# Patient Record
Sex: Male | Born: 1965 | Race: White | Hispanic: No | Marital: Single | Smoking: Current every day smoker
Health system: Southern US, Community
[De-identification: ages and names within clinical notes are randomized; demographics above are authoritative.]

## PROBLEM LIST (undated history)

## (undated) DIAGNOSIS — J449 Chronic obstructive pulmonary disease, unspecified: Secondary | ICD-10-CM

## (undated) DIAGNOSIS — F32A Depression, unspecified: Secondary | ICD-10-CM

## (undated) DIAGNOSIS — I10 Essential (primary) hypertension: Secondary | ICD-10-CM

## (undated) DIAGNOSIS — F329 Major depressive disorder, single episode, unspecified: Secondary | ICD-10-CM

## (undated) DIAGNOSIS — K219 Gastro-esophageal reflux disease without esophagitis: Secondary | ICD-10-CM

## (undated) HISTORY — DX: Essential (primary) hypertension: I10

## (undated) HISTORY — PX: APPENDECTOMY: SHX54

## (undated) HISTORY — DX: Gastro-esophageal reflux disease without esophagitis: K21.9

## (undated) HISTORY — PX: HERNIA REPAIR: SHX51

---

## 2013-01-23 HISTORY — PX: OTHER SURGICAL HISTORY: SHX169

## 2018-04-21 ENCOUNTER — Emergency Department: Payer: Self-pay

## 2018-04-21 ENCOUNTER — Inpatient Hospital Stay
Admission: EM | Admit: 2018-04-21 | Discharge: 2018-04-25 | DRG: 342 | Disposition: A | Discharge status: Home Health | Payer: Self-pay | Attending: Surgery | Admitting: Surgery

## 2018-04-21 ENCOUNTER — Encounter
Admission: EM | Disposition: A | Discharge status: Home Health | Payer: Self-pay | Source: Home / Self Care | Attending: Surgery

## 2018-04-21 ENCOUNTER — Inpatient Hospital Stay: Payer: Self-pay | Admitting: Anesthesiology

## 2018-04-21 ENCOUNTER — Other Ambulatory Visit: Payer: Self-pay

## 2018-04-21 DIAGNOSIS — J449 Chronic obstructive pulmonary disease, unspecified: Secondary | ICD-10-CM | POA: Diagnosis present

## 2018-04-21 DIAGNOSIS — Z23 Encounter for immunization: Secondary | ICD-10-CM

## 2018-04-21 DIAGNOSIS — K35891 Other acute appendicitis without perforation, with gangrene: Principal | ICD-10-CM | POA: Diagnosis present

## 2018-04-21 DIAGNOSIS — E860 Dehydration: Secondary | ICD-10-CM | POA: Diagnosis present

## 2018-04-21 DIAGNOSIS — K3580 Unspecified acute appendicitis: Secondary | ICD-10-CM | POA: Diagnosis present

## 2018-04-21 DIAGNOSIS — F329 Major depressive disorder, single episode, unspecified: Secondary | ICD-10-CM | POA: Diagnosis present

## 2018-04-21 DIAGNOSIS — E876 Hypokalemia: Secondary | ICD-10-CM | POA: Diagnosis not present

## 2018-04-21 DIAGNOSIS — F17210 Nicotine dependence, cigarettes, uncomplicated: Secondary | ICD-10-CM | POA: Diagnosis present

## 2018-04-21 DIAGNOSIS — R0602 Shortness of breath: Secondary | ICD-10-CM

## 2018-04-21 DIAGNOSIS — K567 Ileus, unspecified: Secondary | ICD-10-CM | POA: Diagnosis not present

## 2018-04-21 DIAGNOSIS — K429 Umbilical hernia without obstruction or gangrene: Secondary | ICD-10-CM | POA: Diagnosis present

## 2018-04-21 HISTORY — PX: UMBILICAL HERNIA REPAIR: SHX196

## 2018-04-21 HISTORY — DX: Chronic obstructive pulmonary disease, unspecified: J44.9

## 2018-04-21 HISTORY — DX: Major depressive disorder, single episode, unspecified: F32.9

## 2018-04-21 HISTORY — PX: LAPAROSCOPIC APPENDECTOMY: SHX408

## 2018-04-21 HISTORY — DX: Depression, unspecified: F32.A

## 2018-04-21 LAB — COMPREHENSIVE METABOLIC PANEL
ALT: 17 U/L (ref 0–44)
AST: 24 U/L (ref 15–41)
Albumin: 4.4 g/dL (ref 3.5–5.0)
Alkaline Phosphatase: 70 U/L (ref 38–126)
Anion gap: 14 (ref 5–15)
BUN: 13 mg/dL (ref 6–20)
CO2: 21 mmol/L — ABNORMAL LOW (ref 22–32)
Calcium: 9.5 mg/dL (ref 8.9–10.3)
Chloride: 99 mmol/L (ref 98–111)
Creatinine, Ser: 1.05 mg/dL (ref 0.61–1.24)
GFR calc Af Amer: >60 mL/min (ref >60)
GFR calc non Af Amer: >60 mL/min (ref >60)
Glucose, Bld: 154 mg/dL — ABNORMAL HIGH (ref 70–99)
Potassium: 4.1 mmol/L (ref 3.5–5.1)
Sodium: 134 mmol/L — ABNORMAL LOW (ref 135–145)
Total Bilirubin: 1.6 mg/dL — ABNORMAL HIGH (ref 0.3–1.2)
Total Protein: 8.2 g/dL — ABNORMAL HIGH (ref 6.5–8.1)

## 2018-04-21 LAB — URINALYSIS, COMPLETE (UACMP) WITH MICROSCOPIC
BACTERIA UA: NONE SEEN
Bilirubin Urine: NEGATIVE
Glucose, UA: NEGATIVE mg/dL
Hgb urine dipstick: NEGATIVE
Ketones, ur: 20 mg/dL — AB
Leukocytes,Ua: NEGATIVE
NITRITE: NEGATIVE
Protein, ur: NEGATIVE mg/dL
Specific Gravity, Urine: >1.046 — ABNORMAL HIGH (ref 1.005–1.030)
pH: 6 (ref 5.0–8.0)

## 2018-04-21 LAB — URINE DRUG SCREEN, QUALITATIVE (ARMC ONLY)
Amphetamines, Ur Screen: NOT DETECTED
BARBITURATES, UR SCREEN: NOT DETECTED
Benzodiazepine, Ur Scrn: NOT DETECTED
Cannabinoid 50 Ng, Ur ~~LOC~~: POSITIVE — AB
Cocaine Metabolite,Ur ~~LOC~~: NOT DETECTED
MDMA (Ecstasy)Ur Screen: NOT DETECTED
METHADONE SCREEN, URINE: NOT DETECTED
Opiate, Ur Screen: NOT DETECTED
Phencyclidine (PCP) Ur S: NOT DETECTED
Tricyclic, Ur Screen: NOT DETECTED

## 2018-04-21 LAB — BASIC METABOLIC PANEL
ANION GAP: 8 (ref 5–15)
BUN: 13 mg/dL (ref 6–20)
CALCIUM: 8.1 mg/dL — AB (ref 8.9–10.3)
CO2: 23 mmol/L (ref 22–32)
Chloride: 102 mmol/L (ref 98–111)
Creatinine, Ser: 1.13 mg/dL (ref 0.61–1.24)
GFR calc Af Amer: >60 mL/min (ref >60)
GFR calc non Af Amer: >60 mL/min (ref >60)
Glucose, Bld: 119 mg/dL — ABNORMAL HIGH (ref 70–99)
Potassium: 3.6 mmol/L (ref 3.5–5.1)
Sodium: 133 mmol/L — ABNORMAL LOW (ref 135–145)

## 2018-04-21 LAB — CBC WITH DIFFERENTIAL/PLATELET
Abs Immature Granulocytes: 0.12 10*3/uL — ABNORMAL HIGH (ref 0.00–0.07)
BASOS ABS: 0 10*3/uL (ref 0.0–0.1)
Basophils Relative: 0 %
Eosinophils Absolute: 0 10*3/uL (ref 0.0–0.5)
Eosinophils Relative: 0 %
HCT: 43.7 % (ref 39.0–52.0)
Hemoglobin: 15.3 g/dL (ref 13.0–17.0)
Immature Granulocytes: 1 %
Lymphocytes Relative: 5 %
Lymphs Abs: 0.8 10*3/uL (ref 0.7–4.0)
MCH: 31.7 pg (ref 26.0–34.0)
MCHC: 35 g/dL (ref 30.0–36.0)
MCV: 90.7 fL (ref 80.0–100.0)
Monocytes Absolute: 1.1 10*3/uL — ABNORMAL HIGH (ref 0.1–1.0)
Monocytes Relative: 7 %
NRBC: 0 % (ref 0.0–0.2)
Neutro Abs: 14.5 10*3/uL — ABNORMAL HIGH (ref 1.7–7.7)
Neutrophils Relative %: 87 %
Platelets: 253 10*3/uL (ref 150–400)
RBC: 4.82 MIL/uL (ref 4.22–5.81)
RDW: 11.9 % (ref 11.5–15.5)
WBC: 16.5 10*3/uL — ABNORMAL HIGH (ref 4.0–10.5)

## 2018-04-21 LAB — TYPE AND SCREEN
ABO/RH(D): O NEG
Antibody Screen: NEGATIVE

## 2018-04-21 LAB — LIPASE, BLOOD: Lipase: 24 U/L (ref 11–51)

## 2018-04-21 SURGERY — APPENDECTOMY, LAPAROSCOPIC
Anesthesia: General

## 2018-04-21 MED ORDER — HYDROMORPHONE HCL 1 MG/ML IJ SOLN
0.5000 mg | INTRAMUSCULAR | Status: DC | PRN
  Administered 2018-04-21 – 2018-04-22 (×4): 0.5 mg via INTRAVENOUS
  Filled 2018-04-21 (×2): qty 0.5
  Filled 2018-04-21: qty 1
  Filled 2018-04-21: qty 0.5

## 2018-04-21 MED ORDER — FENTANYL CITRATE (PF) 100 MCG/2ML IJ SOLN
INTRAMUSCULAR | Status: DC | PRN
  Administered 2018-04-21 – 2018-04-22 (×2): 50 ug via INTRAVENOUS

## 2018-04-21 MED ORDER — SODIUM CHLORIDE 0.9 % IV BOLUS
1000.0000 mL | Freq: Once | INTRAVENOUS | Status: AC
  Administered 2018-04-21: 1000 mL via INTRAVENOUS

## 2018-04-21 MED ORDER — IOPAMIDOL (ISOVUE-300) INJECTION 61%: 100.0000 mL | Freq: Once | INTRAVENOUS | Status: DC | PRN

## 2018-04-21 MED ORDER — ONDANSETRON 4 MG PO TBDP: 4.0000 mg | ORAL_TABLET | Freq: Four times a day (QID) | ORAL | Status: DC | PRN

## 2018-04-21 MED ORDER — PNEUMOCOCCAL VAC POLYVALENT 25 MCG/0.5ML IJ INJ
0.5000 mL | INJECTION | INTRAMUSCULAR | Status: AC
  Administered 2018-04-22: 0.5 mL via INTRAMUSCULAR
  Filled 2018-04-21: qty 0.5

## 2018-04-21 MED ORDER — LACTATED RINGERS IV SOLN
INTRAVENOUS | Status: DC | PRN
  Administered 2018-04-21: 23:00:00 via INTRAVENOUS

## 2018-04-21 MED ORDER — ONDANSETRON 4 MG PO TBDP: 4.0000 mg | ORAL_TABLET | Freq: Once | ORAL | Status: DC

## 2018-04-21 MED ORDER — MIDAZOLAM HCL 2 MG/2ML IJ SOLN
INTRAMUSCULAR | Status: AC
  Filled 2018-04-21: qty 2

## 2018-04-21 MED ORDER — MIDAZOLAM HCL 2 MG/2ML IJ SOLN
INTRAMUSCULAR | Status: DC | PRN
  Administered 2018-04-21: 2 mg via INTRAVENOUS

## 2018-04-21 MED ORDER — FENTANYL CITRATE (PF) 250 MCG/5ML IJ SOLN
INTRAMUSCULAR | Status: AC
  Filled 2018-04-21: qty 5

## 2018-04-21 MED ORDER — PHENYLEPHRINE HCL 10 MG/ML IJ SOLN
INTRAMUSCULAR | Status: DC | PRN
  Administered 2018-04-21: 100 ug via INTRAVENOUS

## 2018-04-21 MED ORDER — HYDROMORPHONE HCL 1 MG/ML IJ SOLN
1.0000 mg | INTRAMUSCULAR | Status: AC
  Administered 2018-04-21: 1 mg via INTRAVENOUS

## 2018-04-21 MED ORDER — PROPOFOL 10 MG/ML IV BOLUS
INTRAVENOUS | Status: DC | PRN
  Administered 2018-04-21: 120 mg via INTRAVENOUS

## 2018-04-21 MED ORDER — HYDROMORPHONE HCL 1 MG/ML IJ SOLN
INTRAMUSCULAR | Status: AC
  Filled 2018-04-21: qty 1

## 2018-04-21 MED ORDER — IOHEXOL 300 MG/ML  SOLN
100.0000 mL | Freq: Once | INTRAMUSCULAR | Status: AC | PRN
  Administered 2018-04-21: 100 mL via INTRAVENOUS

## 2018-04-21 MED ORDER — LACTATED RINGERS IV SOLN
125.0000 mL/h | INTRAVENOUS | Status: DC
  Administered 2018-04-21 (×2): 125 mL/h via INTRAVENOUS

## 2018-04-21 MED ORDER — ACETAMINOPHEN 10 MG/ML IV SOLN
INTRAVENOUS | Status: AC
  Filled 2018-04-21: qty 100

## 2018-04-21 MED ORDER — SUCCINYLCHOLINE CHLORIDE 20 MG/ML IJ SOLN
INTRAMUSCULAR | Status: DC | PRN
  Administered 2018-04-21: 140 mg via INTRAVENOUS

## 2018-04-21 MED ORDER — BUPIVACAINE LIPOSOME 1.3 % IJ SUSP
INTRAMUSCULAR | Status: AC
  Filled 2018-04-21: qty 20

## 2018-04-21 MED ORDER — BUPIVACAINE-EPINEPHRINE (PF) 0.25% -1:200000 IJ SOLN
INTRAMUSCULAR | Status: AC
  Filled 2018-04-21: qty 30

## 2018-04-21 MED ORDER — PIPERACILLIN-TAZOBACTAM 3.375 G IVPB
3.3750 g | Freq: Three times a day (TID) | INTRAVENOUS | Status: DC
  Administered 2018-04-21 – 2018-04-25 (×12): 3.375 g via INTRAVENOUS
  Filled 2018-04-21 (×12): qty 50

## 2018-04-21 MED ORDER — LIDOCAINE HCL (CARDIAC) PF 100 MG/5ML IV SOSY
PREFILLED_SYRINGE | INTRAVENOUS | Status: DC | PRN
  Administered 2018-04-21: 100 mg via INTRAVENOUS

## 2018-04-21 MED ORDER — ACETAMINOPHEN 10 MG/ML IV SOLN
INTRAVENOUS | Status: DC | PRN
  Administered 2018-04-21: 1000 mg via INTRAVENOUS

## 2018-04-21 MED ORDER — INFLUENZA VAC SPLIT QUAD 0.5 ML IM SUSY
0.5000 mL | PREFILLED_SYRINGE | INTRAMUSCULAR | Status: AC
  Administered 2018-04-22: 0.5 mL via INTRAMUSCULAR
  Filled 2018-04-21 (×2): qty 0.5

## 2018-04-21 MED ORDER — KETOROLAC TROMETHAMINE 30 MG/ML IJ SOLN
INTRAMUSCULAR | Status: AC
  Filled 2018-04-21: qty 1

## 2018-04-21 MED ORDER — PANTOPRAZOLE SODIUM 40 MG IV SOLR
40.0000 mg | Freq: Every day | INTRAVENOUS | Status: DC
  Administered 2018-04-21 – 2018-04-24 (×4): 40 mg via INTRAVENOUS
  Filled 2018-04-21 (×4): qty 40

## 2018-04-21 MED ORDER — ONDANSETRON HCL 4 MG/2ML IJ SOLN
INTRAMUSCULAR | Status: AC
  Administered 2018-04-21: 4 mg
  Filled 2018-04-21: qty 2

## 2018-04-21 MED ORDER — KETOROLAC TROMETHAMINE 30 MG/ML IJ SOLN
30.0000 mg | Freq: Four times a day (QID) | INTRAMUSCULAR | Status: DC
  Administered 2018-04-21 (×2): 30 mg via INTRAVENOUS
  Filled 2018-04-21: qty 1

## 2018-04-21 MED ORDER — ONDANSETRON HCL 4 MG/2ML IJ SOLN
4.0000 mg | Freq: Four times a day (QID) | INTRAMUSCULAR | Status: DC | PRN
  Administered 2018-04-21: 4 mg via INTRAVENOUS
  Filled 2018-04-21 (×2): qty 2

## 2018-04-21 MED ORDER — SODIUM CHLORIDE 0.9 % IV SOLN
INTRAVENOUS | Status: DC | PRN
  Administered 2018-04-21 – 2018-04-22 (×2): 250 mL via INTRAVENOUS
  Administered 2018-04-23: 1 mL via INTRAVENOUS

## 2018-04-21 MED ORDER — ENOXAPARIN SODIUM 40 MG/0.4ML ~~LOC~~ SOLN
40.0000 mg | SUBCUTANEOUS | Status: DC
  Administered 2018-04-21 – 2018-04-24 (×4): 40 mg via SUBCUTANEOUS
  Filled 2018-04-21 (×4): qty 0.4

## 2018-04-21 SURGICAL SUPPLY — 39 items
BULB RESERV EVAC DRAIN JP 100C (MISCELLANEOUS) ×4 IMPLANT
CANISTER SUCT 1200ML W/VALVE (MISCELLANEOUS) ×4 IMPLANT
CHLORAPREP W/TINT 10.5 ML (MISCELLANEOUS) ×4 IMPLANT
COVER WAND RF STERILE (DRAPES) ×4 IMPLANT
CUTTER FLEX LINEAR 45M (STAPLE) ×4 IMPLANT
DRAIN CHANNEL JP 19F (MISCELLANEOUS) ×4 IMPLANT
DRAPE LAPAROTOMY 100X77 ABD (DRAPES) ×4 IMPLANT
DRSG OPSITE POSTOP 4X12 (GAUZE/BANDAGES/DRESSINGS) ×4 IMPLANT
DRSG TEGADERM 4X10 (GAUZE/BANDAGES/DRESSINGS) ×4 IMPLANT
ELECT CAUTERY BLADE 6.4 (BLADE) ×4 IMPLANT
ELECT REM PT RETURN 9FT ADLT (ELECTROSURGICAL) ×4
ELECTRODE REM PT RTRN 9FT ADLT (ELECTROSURGICAL) ×2 IMPLANT
GAUZE SPONGE 4X4 12PLY STRL (GAUZE/BANDAGES/DRESSINGS) ×4 IMPLANT
GLOVE SURG SYN 7.0 (GLOVE) ×8 IMPLANT
GLOVE SURG SYN 7.5  E (GLOVE) ×4
GLOVE SURG SYN 7.5 E (GLOVE) ×4 IMPLANT
GOWN STRL REUS W/ TWL LRG LVL3 (GOWN DISPOSABLE) ×8 IMPLANT
GOWN STRL REUS W/TWL LRG LVL3 (GOWN DISPOSABLE) ×8
LABEL OR SOLS (LABEL) ×4 IMPLANT
LIGASURE IMPACT 36 18CM CVD LR (INSTRUMENTS) IMPLANT
LIGASURE LAP MARYLAND 5MM 37CM (ELECTROSURGICAL) ×4 IMPLANT
NEEDLE HYPO 22GX1.5 SAFETY (NEEDLE) ×4 IMPLANT
NS IRRIG 1000ML POUR BTL (IV SOLUTION) ×4 IMPLANT
PACK BASIN MAJOR ARMC (MISCELLANEOUS) ×4 IMPLANT
PACK COLON CLEAN CLOSURE (MISCELLANEOUS) ×4 IMPLANT
POUCH SPECIMEN RETRIEVAL 10MM (ENDOMECHANICALS) ×8 IMPLANT
RELOAD 45 VASCULAR/THIN (ENDOMECHANICALS) ×4 IMPLANT
RELOAD STAPLE TA45 3.5 REG BLU (ENDOMECHANICALS) ×8 IMPLANT
SEPRAFILM MEMBRANE 5X6 (MISCELLANEOUS) IMPLANT
STAPLER SKIN PROX 35W (STAPLE) ×4 IMPLANT
SUT PDS AB 1 CT1 36 (SUTURE) ×4 IMPLANT
SUT PROLENE 0 CT 1 30 (SUTURE) ×12 IMPLANT
SUT SILK 2 0 (SUTURE) ×2
SUT SILK 2-0 18XBRD TIE 12 (SUTURE) ×2 IMPLANT
SUT SILK 3-0 (SUTURE) ×4 IMPLANT
SUT VIC AB 3-0 SH 27 (SUTURE) ×2
SUT VIC AB 3-0 SH 27X BRD (SUTURE) ×2 IMPLANT
SYR 10ML LL (SYRINGE) ×4 IMPLANT
TRAY FOLEY MTR SLVR 16FR STAT (SET/KITS/TRAYS/PACK) ×4 IMPLANT

## 2018-04-21 NOTE — ED Triage Notes (Signed)
Pt arrived via ems for report of right lower quad pai nwith N/V since last pm - noted large amount of green vomit in ems emesis bag

## 2018-04-21 NOTE — Anesthesia Preprocedure Evaluation (Signed)
Anesthesia Evaluation  Patient identified by MRN, date of birth, ID band Patient awake    Reviewed: Allergy & Precautions, H&P , NPO status , Patient's Chart, lab work & pertinent test results  History of Anesthesia Complications Negative for: history of anesthetic complications  Airway Mallampati: III  TM Distance: <3 FB Neck ROM: limited    Dental  (+) Chipped, Poor Dentition, Missing   Pulmonary shortness of breath and with exertion, COPD, Current Smoker,           Cardiovascular Exercise Tolerance: Good (-) angina(-) Past MI and (-) DOE negative cardio ROS       Neuro/Psych PSYCHIATRIC DISORDERS negative neurological ROS     GI/Hepatic negative GI ROS, Neg liver ROS, neg GERD  ,  Endo/Other  negative endocrine ROS  Renal/GU      Musculoskeletal   Abdominal   Peds  Hematology negative hematology ROS (+)   Anesthesia Other Findings Past Medical History: No date: COPD (chronic obstructive pulmonary disease) (HCC) No date: Depression  History reviewed. No pertinent surgical history.  BMI    Body Mass Index:  22.24 kg/m      Reproductive/Obstetrics negative OB ROS                             Anesthesia Physical Anesthesia Plan  ASA: III and emergent  Anesthesia Plan: General ETT   Post-op Pain Management:    Induction: Intravenous  PONV Risk Score and Plan: Ondansetron, Dexamethasone, Midazolam and Treatment may vary due to age or medical condition  Airway Management Planned: Oral ETT  Additional Equipment:   Intra-op Plan:   Post-operative Plan: Extubation in OR and Possible Post-op intubation/ventilation  Informed Consent: I have reviewed the patients History and Physical, chart, labs and discussed the procedure including the risks, benefits and alternatives for the proposed anesthesia with the patient or authorized representative who has indicated his/her  understanding and acceptance.     Dental Advisory Given  Plan Discussed with: Anesthesiologist, CRNA and Surgeon  Anesthesia Plan Comments: (Patient consented for risks of anesthesia including but not limited to:  - adverse reactions to medications - damage to teeth, lips or other oral mucosa - sore throat or hoarseness - Damage to heart, brain, lungs or loss of life  Patient voiced understanding.)        Anesthesia Quick Evaluation

## 2018-04-21 NOTE — ED Notes (Addendum)
Noted O2 sat 81% on RA with good waveform. Pt placed on 2L O2 via n/c with O2 increase to 97%. Primary RN aware.

## 2018-04-21 NOTE — ED Notes (Signed)
Report given to Julie RN.
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
## 2018-04-21 NOTE — Progress Notes (Signed)
04/21/18  Patient developing low grade temperature tonight.  Continues having significant pain in the right side of abdomen.  Has low urine volume on bladder scan for period of 9 hours.  On exam, exquisitely tender int he right abdomen, peritoneal signs.  Given some clinical deterioration with persistent pain, will take to OR tonight.  Will attempt laparoscopic appendectomy.  However, discussed with patient that if there is too much inflammation that has extended beyond the appendix, we would have to do a bigger resection like a right colectomy in order to be able to have clean healthy tissue edges.  Patient understands and is willing to proceed.   Henrene Dodge, MD

## 2018-04-21 NOTE — ED Notes (Signed)
ED TO INPATIENT HANDOFF REPORT  ED Nurse Name and Phone #: Rosey Bath [phone]  S Name/Age/Gender Margretta Sidle 53 y.o. male Room/Bed: ED15A/ED15A  Code Status   Code Status: Full Code  Home/SNF/Other Home Patient oriented to: self, place, time and situation Is this baseline? Yes   Triage Complete: Triage complete  Chief Complaint abdominal pain  Triage Note Pt arrived via ems for report of right lower quad pai nwith N/V since last pm - noted large amount of green vomit in ems emesis bag   Allergies No Known Allergies  Level of Care/Admitting Diagnosis ED Disposition    ED Disposition Condition Comment   Admit  Hospital Area: [facility] [[id number]]  Level of Care: Med-Surg [16]  Diagnosis: Acute appendicitis [960454]  Admitting Physician: Henrene Dodge [[id number]]  Attending Physician: Henrene Dodge [[id number]]  Estimated length of stay: 3 - 4 days  Certification:: I certify this patient will need inpatient services for at least 2 midnights  PT Class (Do Not Modify): Inpatient [101]  PT Acc Code (Do Not Modify): Private [1]       B Medical/Surgery History Past Medical History:  Diagnosis Date  . COPD (chronic obstructive pulmonary disease) (HCC)   . Depression    History reviewed. No pertinent surgical history.   A IV Location/Drains/Wounds Patient Lines/Drains/Airways Status   Active Line/Drains/Airways    Name:   Placement date:   Placement time:   Site:   Days:   Peripheral IV 04/21/18 Left Wrist   04/21/18    0935    Wrist   less than 1   Peripheral IV 04/21/18 Left Antecubital   04/21/18    0947    Antecubital   less than 1          Intake/Output Last 24 hours  Intake/Output Summary (Last 24 hours) at 04/21/2018 1215 Last data filed at 04/21/2018 1159 Gross per 24 hour  Intake 1000 ml  Output -  Net 1000 ml    Labs/Imaging Results for orders placed or performed during the hospital encounter of 04/21/18 (from the past 48 hour(s))   Lipase, blood     Status: None   Collection Time: 04/21/18  9:47 AM  Result Value Ref Range   Lipase 24 11 - 51 U/L    Comment: Performed at [facility], [address] [place], [place] [address]  Comprehensive metabolic panel     Status: Abnormal   Collection Time: 04/21/18  9:47 AM  Result Value Ref Range   Sodium 134 (L) 135 - 145 mmol/L   Potassium 4.1 3.5 - 5.1 mmol/L   Chloride 99 98 - 111 mmol/L   CO2 21 (L) 22 - 32 mmol/L   Glucose, Bld 154 (H) 70 - 99 mg/dL   BUN 13 6 - 20 mg/dL   Creatinine, Ser 6.57 0.61 - 1.24 mg/dL   Calcium 9.5 8.9 - 84.6 mg/dL   Total Protein 8.2 (H) 6.5 - 8.1 g/dL   Albumin 4.4 3.5 - 5.0 g/dL   AST 24 15 - 41 U/L   ALT 17 0 - 44 U/L   Alkaline Phosphatase 70 38 - 126 U/L   Total Bilirubin 1.6 (H) 0.3 - 1.2 mg/dL   GFR calc non Af Amer >60 >60 mL/min   GFR calc Af Amer >60 >60 mL/min   Anion gap 14 5 - 15    Comment: Performed at [facility], [address], [place], [place] [address]  CBC with Differential  Status: Abnormal   Collection Time: 04/21/18  9:47 AM  Result Value Ref Range   WBC 16.5 (H) 4.0 - 10.5 K/uL   RBC 4.82 4.22 - 5.81 MIL/uL   Hemoglobin 15.3 13.0 - 17.0 g/dL   HCT 58.0 99.8 - 33.8 %   MCV 90.7 80.0 - 100.0 fL   MCH 31.7 26.0 - 34.0 pg   MCHC 35.0 30.0 - 36.0 g/dL   RDW 25.0 53.9 - 76.7 %   Platelets 253 150 - 400 K/uL   nRBC 0.0 0.0 - 0.2 %   Neutrophils Relative % 87 %   Neutro Abs 14.5 (H) 1.7 - 7.7 K/uL   Lymphocytes Relative 5 %   Lymphs Abs 0.8 0.7 - 4.0 K/uL   Monocytes Relative 7 %   Monocytes Absolute 1.1 (H) 0.1 - 1.0 K/uL   Eosinophils Relative 0 %   Eosinophils Absolute 0.0 0.0 - 0.5 K/uL   Basophils Relative 0 %   Basophils Absolute 0.0 0.0 - 0.1 K/uL   Immature Granulocytes 1 %   Abs Immature Granulocytes 0.12 (H) 0.00 - 0.07 K/uL    Comment: Performed at [facility], [address] [place], [place] [address]   Ct Abdomen Pelvis W Contrast  Result Date:  04/21/2018 CLINICAL DATA:  Right lower quadrant pain starting last night. Appendicitis suspected. EXAM: CT ABDOMEN AND PELVIS WITH CONTRAST TECHNIQUE: Multidetector CT imaging of the abdomen and pelvis was performed using the standard protocol following bolus administration of intravenous contrast. CONTRAST:  OMNIPAQUE IOHEXOL 300 MG/ML SOLN, <See Chart> ISOVUE-300 IOPAMIDOL (ISOVUE-300) INJECTION 61% COMPARISON:  None. FINDINGS: Lower chest: Emphysema. Right base subsegmental atelectasis. Normal heart size without pericardial or pleural effusion. Tiny hiatal hernia. Hepatobiliary: Normal liver. Normal gallbladder, without biliary ductal dilatation. Pancreas: Normal, without mass or ductal dilatation. Spleen: Normal in size, without focal abnormality. Adrenals/Urinary Tract: Left greater than right adrenal thickening. Bilateral too small to characterize renal lesions, likely cysts. No hydronephrosis. Normal urinary bladder. Stomach/Bowel: Normal remainder of the stomach. Extensive colonic diverticulosis. The terminal ileum is positioned anterior to inflammation including on image 47/2. Appendix: Location: Typical. Diameter: 1.7 cm proximally on image 45/2. Fluid and gas filled distally, including at up to 1.6 cm on image 34/2. Appendicolith: Absent Mucosal hyper-enhancement: Absent Extraluminal gas: Suspected, trace medially on image 41/5 Periappendiceal collection: Moderate periappendiceal edema, without well-circumscribed fluid collection. Normal small bowel. Vascular/Lymphatic: Aortic atherosclerosis. Upper normal ileocolic mesenteric nodes, including at 7 mm on image 38/2. Likely reactive. No abdominal and no pelvic sidewall adenopathy. Reproductive: Normal prostate. Other: Trace cul-de-sac fluid, including on image 67/2. Musculoskeletal: Transitional S1 vertebral body. IMPRESSION: 1. Findings consistent with appendicitis. Possible microperforation, with subtle periappendiceal gas suspected. No  extraluminal fluid collection. 2.  Tiny hiatal hernia. 3.  Aortic Atherosclerosis (ICD10-I70.0). 4. Trace cul-de-sac fluid, likely secondary. 5.  Emphysema (ICD10-J43.9). These results were called by telephone at the time of interpretation on 04/21/2018 at 10:46 am to Dr. Sharyn Creamer , who verbally acknowledged these results. Electronically Signed   By: Jeronimo Greaves M.D.   On: 04/21/2018 10:49   Dg Chest Port 1 View  Result Date: 04/21/2018 CLINICAL DATA:  Right lower quadrant pain, nausea and vomiting. Hypoxia on room air with history of smoking and COPD. EXAM: PORTABLE CHEST 1 VIEW COMPARISON:  None. FINDINGS: The heart size and mediastinal contours are within normal limits. Bronchial prominence bilaterally specially in the lower lung zones and mild diffuse bilateral interstitial prominence. This likely reflects underlying chronic lung  disease. Component active bronchitis cannot be excluded. No overt edema, focal airspace consolidation or pneumothorax identified. The visualized skeletal structures are unremarkable. IMPRESSION: Bronchial and interstitial prominence in the lungs likely reflects chronic disease. Active bronchitis cannot be excluded. Electronically Signed   By: Irish Lack M.D.   On: 04/21/2018 10:08    Pending Labs Unresulted Labs (From admission, onward)    Start     Ordered   04/22/18 0500  Basic metabolic panel  Tomorrow morning,   STAT     04/21/18 1202   04/22/18 0500  Magnesium  Tomorrow morning,   STAT     04/21/18 1202   04/22/18 0500  CBC WITH DIFFERENTIAL  Tomorrow morning,   STAT     04/21/18 1202   04/21/18 1204  Urine Drug Screen, Qualitative ([facility] only)  Once,   STAT     04/21/18 1203   04/21/18 1201  HIV antibody (Routine Testing)  Once,   STAT     04/21/18 1202   04/21/18 0939  Urinalysis, Complete w Microscopic  ONCE - STAT,   STAT     04/21/18 0938          Vitals/Pain Today's Vitals   04/21/18 0954 04/21/18 0957 04/21/18 0958 04/21/18 1000  BP:     129/77  Pulse: 80 82 90 76  Resp: 12 (!) 32 (!) 28 16  Temp:      TempSrc:      SpO2: (!) 89% (!) 81% 97% 97%  Weight:      Height:      PainSc:        Isolation Precautions No active isolations  Medications Medications  iopamidol (ISOVUE-300) 61 % injection 100 mL ( Intravenous Canceled Entry 04/21/18 1022)  lactated ringers infusion (has no administration in time range)  ketorolac (TORADOL) 30 MG/ML injection 30 mg (has no administration in time range)  HYDROmorphone (DILAUDID) injection 0.5 mg (has no administration in time range)  ondansetron (ZOFRAN-ODT) disintegrating tablet 4 mg (has no administration in time range)    Or  ondansetron (ZOFRAN) injection 4 mg (has no administration in time range)  pantoprazole (PROTONIX) injection 40 mg (has no administration in time range)  enoxaparin (LOVENOX) injection 40 mg (has no administration in time range)  piperacillin-tazobactam (ZOSYN) IVPB 3.375 g (has no administration in time range)  ketorolac (TORADOL) 30 MG/ML injection (has no administration in time range)  HYDROmorphone (DILAUDID) injection 1 mg (1 mg Intravenous Given 04/21/18 0945)  sodium chloride 0.9 % bolus 1,000 mL (0 mLs Intravenous Stopped 04/21/18 1159)  ondansetron (ZOFRAN) 4 MG/2ML injection (4 mg  Given 04/21/18 0946)  iohexol (OMNIPAQUE) 300 MG/ML solution 100 mL (100 mLs Intravenous Contrast Given 04/21/18 1022)    Mobility walks Low fall risk   Focused Assessments Cardiac Assessment Handoff:    No results found for: CKTOTAL, CKMB, CKMBINDEX, TROPONINI No results found for: DDIMER Does the Patient currently have chest pain? No      R Recommendations: See Admitting Provider Note  Report given to:   Additional Notes: appendicitis that may require surgery today rather than waiting until tomorrow
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
## 2018-04-22 ENCOUNTER — Encounter: Payer: Self-pay | Admitting: Surgery

## 2018-04-22 DIAGNOSIS — K429 Umbilical hernia without obstruction or gangrene: Secondary | ICD-10-CM

## 2018-04-22 LAB — CBC WITH DIFFERENTIAL/PLATELET
Abs Immature Granulocytes: 0.05 10*3/uL (ref 0.00–0.07)
BASOS PCT: 0 %
Basophils Absolute: 0 10*3/uL (ref 0.0–0.1)
Eosinophils Absolute: 0.1 10*3/uL (ref 0.0–0.5)
Eosinophils Relative: 1 %
HCT: 39.6 % (ref 39.0–52.0)
HEMOGLOBIN: 13.1 g/dL (ref 13.0–17.0)
Immature Granulocytes: 0 %
Lymphocytes Relative: 4 %
Lymphs Abs: 0.7 10*3/uL (ref 0.7–4.0)
MCH: 31.7 pg (ref 26.0–34.0)
MCHC: 33.1 g/dL (ref 30.0–36.0)
MCV: 95.9 fL (ref 80.0–100.0)
Monocytes Absolute: 0.6 10*3/uL (ref 0.1–1.0)
Monocytes Relative: 4 %
Neutro Abs: 13.9 10*3/uL — ABNORMAL HIGH (ref 1.7–7.7)
Neutrophils Relative %: 91 %
Platelets: 203 10*3/uL (ref 150–400)
RBC: 4.13 MIL/uL — ABNORMAL LOW (ref 4.22–5.81)
RDW: 12.2 % (ref 11.5–15.5)
Smear Review: NORMAL
WBC: 15.3 10*3/uL — AB (ref 4.0–10.5)
nRBC: 0 % (ref 0.0–0.2)

## 2018-04-22 LAB — BASIC METABOLIC PANEL
Anion gap: 7 (ref 5–15)
BUN: 12 mg/dL (ref 6–20)
CO2: 25 mmol/L (ref 22–32)
Calcium: 7.9 mg/dL — ABNORMAL LOW (ref 8.9–10.3)
Chloride: 102 mmol/L (ref 98–111)
Creatinine, Ser: 1.12 mg/dL (ref 0.61–1.24)
GFR calc Af Amer: >60 mL/min (ref >60)
GFR calc non Af Amer: >60 mL/min (ref >60)
Glucose, Bld: 154 mg/dL — ABNORMAL HIGH (ref 70–99)
Potassium: 3.9 mmol/L (ref 3.5–5.1)
Sodium: 134 mmol/L — ABNORMAL LOW (ref 135–145)

## 2018-04-22 LAB — MAGNESIUM: MAGNESIUM: 1.7 mg/dL (ref 1.7–2.4)

## 2018-04-22 LAB — MRSA PCR SCREENING: MRSA by PCR: NEGATIVE

## 2018-04-22 MED ORDER — LIDOCAINE HCL (PF) 2 % IJ SOLN
INTRAMUSCULAR | Status: AC
  Filled 2018-04-22: qty 10

## 2018-04-22 MED ORDER — PHENOL 1.4 % MT LIQD
1.0000 | OROMUCOSAL | Status: DC | PRN
  Administered 2018-04-22 – 2018-04-23 (×2): 1 via OROMUCOSAL
  Filled 2018-04-22: qty 177

## 2018-04-22 MED ORDER — HYDROMORPHONE HCL 1 MG/ML IJ SOLN: 0.2500 mg | INTRAMUSCULAR | Status: DC | PRN

## 2018-04-22 MED ORDER — ONDANSETRON HCL 4 MG/2ML IJ SOLN
INTRAMUSCULAR | Status: DC | PRN
  Administered 2018-04-22: 4 mg via INTRAVENOUS

## 2018-04-22 MED ORDER — KETOROLAC TROMETHAMINE 30 MG/ML IJ SOLN
30.0000 mg | Freq: Four times a day (QID) | INTRAMUSCULAR | Status: DC
  Administered 2018-04-22 – 2018-04-25 (×13): 30 mg via INTRAVENOUS
  Filled 2018-04-22 (×13): qty 1

## 2018-04-22 MED ORDER — KETOROLAC TROMETHAMINE 30 MG/ML IJ SOLN
15.0000 mg | Freq: Four times a day (QID) | INTRAMUSCULAR | Status: DC
  Administered 2018-04-22: 15 mg via INTRAVENOUS
  Filled 2018-04-22: qty 1

## 2018-04-22 MED ORDER — HYDROMORPHONE HCL 1 MG/ML IJ SOLN
0.5000 mg | INTRAMUSCULAR | Status: DC | PRN
  Administered 2018-04-22 – 2018-04-24 (×10): 0.5 mg via INTRAVENOUS
  Filled 2018-04-22 (×10): qty 0.5

## 2018-04-22 MED ORDER — ONDANSETRON HCL 4 MG/2ML IJ SOLN
INTRAMUSCULAR | Status: AC
  Filled 2018-04-22: qty 2

## 2018-04-22 MED ORDER — DEXAMETHASONE SODIUM PHOSPHATE 10 MG/ML IJ SOLN
INTRAMUSCULAR | Status: AC
  Filled 2018-04-22: qty 1

## 2018-04-22 MED ORDER — OXYCODONE HCL 5 MG PO TABS: 5.0000 mg | ORAL_TABLET | Freq: Once | ORAL | Status: DC | PRN

## 2018-04-22 MED ORDER — PROPOFOL 10 MG/ML IV BOLUS
INTRAVENOUS | Status: AC
  Filled 2018-04-22: qty 20

## 2018-04-22 MED ORDER — SUCCINYLCHOLINE CHLORIDE 20 MG/ML IJ SOLN
INTRAMUSCULAR | Status: AC
  Filled 2018-04-22: qty 1

## 2018-04-22 MED ORDER — FENTANYL CITRATE (PF) 100 MCG/2ML IJ SOLN: 25.0000 ug | INTRAMUSCULAR | Status: DC | PRN

## 2018-04-22 MED ORDER — LACTATED RINGERS IV SOLN
INTRAVENOUS | Status: DC
  Administered 2018-04-22 – 2018-04-24 (×7): via INTRAVENOUS

## 2018-04-22 MED ORDER — ROCURONIUM BROMIDE 100 MG/10ML IV SOLN
INTRAVENOUS | Status: DC | PRN
  Administered 2018-04-21: 50 mg via INTRAVENOUS
  Administered 2018-04-22: 20 mg via INTRAVENOUS

## 2018-04-22 MED ORDER — DEXMEDETOMIDINE HCL 200 MCG/2ML IV SOLN
INTRAVENOUS | Status: DC | PRN
  Administered 2018-04-22: 8 ug via INTRAVENOUS

## 2018-04-22 MED ORDER — IPRATROPIUM-ALBUTEROL 0.5-2.5 (3) MG/3ML IN SOLN: 3.0000 mL | Freq: Once | RESPIRATORY_TRACT | Status: DC | PRN

## 2018-04-22 MED ORDER — OXYCODONE HCL 5 MG/5ML PO SOLN: 5.0000 mg | Freq: Once | ORAL | Status: DC | PRN

## 2018-04-22 MED ORDER — SUGAMMADEX SODIUM 500 MG/5ML IV SOLN
INTRAVENOUS | Status: DC | PRN
  Administered 2018-04-22: 200 mg via INTRAVENOUS

## 2018-04-22 MED ORDER — ACETAMINOPHEN 10 MG/ML IV SOLN
1000.0000 mg | Freq: Four times a day (QID) | INTRAVENOUS | Status: AC
  Administered 2018-04-22 – 2018-04-23 (×3): 1000 mg via INTRAVENOUS
  Filled 2018-04-22 (×4): qty 100

## 2018-04-22 MED ORDER — LACTATED RINGERS IV BOLUS
1000.0000 mL | Freq: Once | INTRAVENOUS | Status: AC
  Administered 2018-04-22: 1000 mL via INTRAVENOUS

## 2018-04-22 MED ORDER — ROCURONIUM BROMIDE 50 MG/5ML IV SOLN
INTRAVENOUS | Status: AC
  Filled 2018-04-22: qty 1

## 2018-04-22 MED ORDER — DEXAMETHASONE SODIUM PHOSPHATE 10 MG/ML IJ SOLN
INTRAMUSCULAR | Status: DC | PRN
  Administered 2018-04-22: 5 mg via INTRAVENOUS

## 2018-04-22 MED ORDER — ALBUTEROL SULFATE (2.5 MG/3ML) 0.083% IN NEBU
2.5000 mg | INHALATION_SOLUTION | RESPIRATORY_TRACT | Status: DC | PRN
  Administered 2018-04-22 – 2018-04-25 (×7): 2.5 mg via RESPIRATORY_TRACT
  Filled 2018-04-22 (×7): qty 3

## 2018-04-22 MED ORDER — MAGNESIUM SULFATE 2 GM/50ML IV SOLN
2.0000 g | Freq: Once | INTRAVENOUS | Status: AC
  Administered 2018-04-22: 2 g via INTRAVENOUS
  Filled 2018-04-22: qty 50

## 2018-04-22 NOTE — Anesthesia Procedure Notes (Signed)
Procedure Name: Intubation Date/Time: 04/22/2018 11:49 AM Performed by: Justus Memory, CRNA Pre-anesthesia Checklist: Patient identified, Patient being monitored, Timeout performed, Emergency Drugs available and Suction available Patient Re-evaluated:Patient Re-evaluated prior to induction Oxygen Delivery Method: Circle system utilized Preoxygenation: Pre-oxygenation with 100% oxygen Induction Type: IV induction, Rapid sequence and Cricoid Pressure applied Laryngoscope Size: Mac, 3 and McGraph Grade View: Grade III Tube type: Oral Tube size: 7.0 mm Number of attempts: 1 Airway Equipment and Method: Stylet Placement Confirmation: ETT inserted through vocal cords under direct vision,  positive ETCO2 and breath sounds checked- equal and bilateral Secured at: 22 cm Tube secured with: Tape Dental Injury: Teeth and Oropharynx as per pre-operative assessment  Difficulty Due To: Difficulty was anticipated and Difficult Airway- due to anterior larynx Future Recommendations: Recommend- induction with short-acting agent, and alternative techniques readily available

## 2018-04-22 NOTE — Progress Notes (Addendum)
[facility] SURGICAL PROGRESS NOTE  Hospital Day(s): 1.   Post op day(s): 1 Day Post-Op.   Interval History: Patient seen and examined, no acute events or new complaints in short post-operative period. Patient reports that his pain is improved from yesterday/admission however he does endorse somewhat significant soreness in his RLQ/Flank. No complaints or reports of fever, chills, nausea, or emesis. He does have audible wheezing this morning. He does still have NGT in place with about 100 ccs out. Foley in place with good U/O. No reports of flatus. Otherwise no additional complaints this morning.   Review of Systems:  Constitutional: denies fever, chills  Respiratory: denies any shortness of breath, + wheezes  Cardiovascular: denies chest pain or palpitations  Gastrointestinal: + abdominal pain (RLQ), denied N/V, or diarrhea/and bowel function as per interval history Integumentary: denies any other rashes or skin discolorations except laparoscopic incisions  Vital signs in last 24 hours: [min-max] current  Temp:  [98.3 F (36.8 C)-100.4 F (38 C)] 98.3 F (36.8 C) (03/30 0849) Pulse Rate:  [73-103] 78 (03/30 0849) Resp:  [14-33] 18 (03/30 0849) BP: (104-155)/(54-98) 128/82 (03/30 0849) SpO2:  [93 %-100 %] 95 % (03/30 0849)     Height: 5\' 10"  (177.8 cm) Weight: 70.3 kg BMI (Calculated): 22.24   Intake/Output this shift:  Total I/O In: 621.6 [I.V.:524.9; IV Piggyback:96.7] Out: 40 [Drains:40]     Physical Exam:  Constitutional: alert, cooperative and no distress  Respiratory: breathing non-labored at rest, audible wheezing  Cardiovascular: regular rate and sinus rhythm  Gastrointestinal: soft, RLQ tenderness, and non-distended. No rebound or guarding. JP in LLQ draining RLQ space with bloody-serosanguinous drainage Integumentary: Laparoscopic incisions are CDI, no erythema or drainage but there is surrounding ecchymosis.   Labs:  CBC Latest Ref Rng & Units  04/22/2018 04/21/2018  WBC 4.0 - 10.5 K/uL 15.3(H) 16.5(H)  Hemoglobin 13.0 - 17.0 g/dL 40.9 81.1  Hematocrit 91.4 - 52.0 % 39.6 43.7  Platelets 150 - 400 K/uL 203 253   CMP Latest Ref Rng & Units 04/22/2018 04/21/2018 04/21/2018  Glucose 70 - 99 mg/dL 782(N) 562(Z) 308(M)  BUN 6 - 20 mg/dL 12 13 13   Creatinine 0.61 - 1.24 mg/dL 5.78 4.69 6.29  Sodium 135 - 145 mmol/L 134(L) 133(L) 134(L)  Potassium 3.5 - 5.1 mmol/L 3.9 3.6 4.1  Chloride 98 - 111 mmol/L 102 102 99  CO2 22 - 32 mmol/L 25 23 21(L)  Calcium 8.9 - 10.3 mg/dL 7.9(L) 8.1(L) 9.5  Total Protein 6.5 - 8.1 g/dL - - 8.2(H)  Total Bilirubin 0.3 - 1.2 mg/dL - - 1.6(H)  Alkaline Phos 38 - 126 U/L - - 70  AST 15 - 41 U/L - - 24  ALT 0 - 44 U/L - - 17     Imaging studies: No new pertinent imaging studies   Assessment/Plan: (ICD-10's: K35.80) 53 y.o. male with improved but still significant RLQ/Flank tenderness with palpation and likely expected post-operative ileus given the significant inflammation seen on CT and intraoperatively 1 Day Post-Op s/p laparoscopic appendectomy and umbilical hernia repair for acute appendicitis.   - Continue NPO + IVF + NGT decompression until bowel function returns  - IV Abx (Zosyn)   - Pain control prn; antiemetics prn  - Will add albuterol nebulizer treatment q4 PRN given significant wheezing this morning  - Closely monitor abdominal examination; vitals; NGT/JP output; ongoing bowel function  - Monitor leukocytosis (improving); AM CBC  - Discontinue foley catheter  - Medical management of comorbidities  -  Mobilization encouraged today  - DVT prophylaxis  All of the above findings and recommendations were discussed with the patient, and the medical team, and all of patient's questions were answered to his expressed satisfaction.  -- Lynden Oxford, PA-C  [facility] 04/22/2018, 10:07 AM [phone] M-F: 7am - 4pm

## 2018-04-22 NOTE — Transfer of Care (Signed)
Immediate Anesthesia Transfer of Care Note  Patient: Jason Cook  Procedure(s) Performed: APPENDECTOMY LAPAROSCOPIC HERNIA REPAIR UMBILICAL ADULT (N/A )  Patient Location: PACU  Anesthesia Type:General  Level of Consciousness: sedated  Airway & Oxygen Therapy: Patient Spontanous Breathing and Patient connected to face mask oxygen  Post-op Assessment: Report given to RN and Post -op Vital signs reviewed and stable  Post vital signs: Reviewed and stable  Last Vitals:  Vitals Value Taken Time  BP 155/98 04/22/2018  2:30 AM  Temp    Pulse 92 04/22/2018  2:36 AM  Resp 20 04/22/2018  2:36 AM  SpO2 96 % 04/22/2018  2:36 AM  Vitals shown include unvalidated device data.  Last Pain:  Vitals:   04/21/18 2220  TempSrc: Oral  PainSc:          Complications: None

## 2018-04-22 NOTE — Anesthesia Post-op Follow-up Note (Signed)
Anesthesia QCDR form completed.        

## 2018-04-22 NOTE — Op Note (Signed)
Procedure Date:  04/22/2018  Pre-operative Diagnosis:   Acute appendicitis  Post-operative Diagnosis:  Gangrenous acute appendicitis and umbilical hernia  Procedure:  Laparoscopic appendectomy and umbilical hernia repair  Surgeon:  Howie Ill, MD  Anesthesia:  General endotracheal  Estimated Blood Loss:  30 ml  Specimens:  appendix  Complications:  None  Indications for Procedure:  This is a 53 y.o. male who presents with abdominal pain and workup revealing acute appendicitis.  The patient had significant inflammation around the colon and ileocecal valve area, and there was high concern that he would need more extensive surgery rather than appendectomy alone.  His clinical status deteriorated earlier in the evening with low grade temp and low urine output. The risks of bleeding, infection, recurrence of symptoms, negative laparoscopy, potential for an open procedure, bowel injury, abscess or infection, were all discussed with the patient and he was willing to proceed.  Description of Procedure: The patient was correctly identified in the preoperative area and brought into the operating room.  The patient was placed supine with VTE prophylaxis in place.  Appropriate time-outs were performed.  Anesthesia was induced and the patient was intubated.  Foley catheter and NG tube were placed.  Appropriate antibiotics were infused.  The abdomen was prepped and draped in a sterile fashion. An infraumbilical incision was made. A cutdown technique was used to enter the abdominal cavity without injury, and a Hasson trocar was inserted.  Pneumoperitoneum was obtained with appropriate opening pressures.  Two 5-mm ports were placed in the suprapubic and left lateral positions under direct visualization.  The right lower quadrant was inspected and was very inflamed.  The terminal ileum was mobilized off the abdominal wall allowing better visualization of the base of the appendix, which was  retrocecal.  The right colon was mobilized off the abdominal wall and there was significant adhesion of the appendix to the colon.  The base of the appendix was healthy but otherwise the appendix was significantly necrotic, to a point that while trying to dissect the appendix, it tore off about 3 cm distal to the base.  The base of the appendix was carefully dissected.  The mesoappendix was divided using the LigaSure.  The base of the appendix was divided with a standard load Endo GIA, taking a sliver of cecum with it to make sure we had a healthy tissue margin.  The appendix base was placed in an Endocatch bag.  Then the distal portion of the appendix was grasped and LigaSure was used to dissect it off the abdominal wall.  There was some purulent fluid and stool like fluid that was released from the appendix.  This was washed and suctioned.  The distal appendix was then placed in a second Endocatch bag. The right lower quadrant was then inspected again revealing an intact staple line, no bleeding, and no bowel injury.  The area was thoroughly irrigated.  A 19 Fr. Blake drain was inserted via the left lateral port and placed going to the pelvis and right lower quadrant near the staple line.  The 5 mm ports were removed under direct visualization and the Hasson trocar was removed.  The Endocatch bag was brought out through the umbilical incision.  The fascial opening was closed using 0 vicryl suture.  The patient was also noted to have an umbilical hernia.  The incision was extended superiorly and the umbilical stalk was dissected off the fascia, revealing a hernia defect measuring about 1 cm.  The fascial edges  were dissected free and the defect was closed with multiple 0 vicryl sutures.  The wound was irrigated and local anesthetic was infused in all incisions.  The umbilical stalk was reattached to the fascia using three 3-0 Vicryl sutures.  Then the umbilical incision was closed in two layers using 3-0 Vicryl  and 4-0 Monocryl.  The suprapubic incision was closed with 4-0 Monocryl, and the drain site was closed with 3-0 Vicryl and 3-0 Nylon to secure the drain in place.  The wounds were cleaned and sealed with DermaBond.  The drain was dressed with gauze and TegaDerm.  The patient was emerged from anesthesia and extubated and brought to the recovery room for further management.  The patient tolerated the procedure well and all counts were correct at the end of the case.   Howie Ill, MD

## 2018-04-22 NOTE — Progress Notes (Signed)
Spoke with Dr. Everlene Farrier regarding patient's increased right sided abdominal pain; continue to monitor.

## 2018-04-23 LAB — CBC
HCT: 35.1 % — ABNORMAL LOW (ref 39.0–52.0)
HEMOGLOBIN: 11.6 g/dL — AB (ref 13.0–17.0)
MCH: 31.8 pg (ref 26.0–34.0)
MCHC: 33 g/dL (ref 30.0–36.0)
MCV: 96.2 fL (ref 80.0–100.0)
Platelets: 182 10*3/uL (ref 150–400)
RBC: 3.65 MIL/uL — ABNORMAL LOW (ref 4.22–5.81)
RDW: 12.6 % (ref 11.5–15.5)
WBC: 9.9 10*3/uL (ref 4.0–10.5)
nRBC: 0 % (ref 0.0–0.2)

## 2018-04-23 LAB — BASIC METABOLIC PANEL
ANION GAP: 7 (ref 5–15)
BUN: 14 mg/dL (ref 6–20)
CO2: 26 mmol/L (ref 22–32)
Calcium: 7.9 mg/dL — ABNORMAL LOW (ref 8.9–10.3)
Chloride: 104 mmol/L (ref 98–111)
Creatinine, Ser: 1.11 mg/dL (ref 0.61–1.24)
GFR calc Af Amer: >60 mL/min (ref >60)
GFR calc non Af Amer: >60 mL/min (ref >60)
Glucose, Bld: 93 mg/dL (ref 70–99)
POTASSIUM: 3.4 mmol/L — AB (ref 3.5–5.1)
Sodium: 137 mmol/L (ref 135–145)

## 2018-04-23 LAB — HIV ANTIBODY (ROUTINE TESTING W REFLEX): HIV Screen 4th Generation wRfx: NONREACTIVE

## 2018-04-23 LAB — MAGNESIUM: Magnesium: 2.2 mg/dL (ref 1.7–2.4)

## 2018-04-23 MED ORDER — POTASSIUM CHLORIDE CRYS ER 20 MEQ PO TBCR
20.0000 meq | EXTENDED_RELEASE_TABLET | Freq: Two times a day (BID) | ORAL | Status: DC
  Administered 2018-04-23 – 2018-04-25 (×5): 20 meq via ORAL
  Filled 2018-04-23 (×5): qty 1

## 2018-04-23 MED ORDER — ACETAMINOPHEN 325 MG PO TABS
650.0000 mg | ORAL_TABLET | Freq: Four times a day (QID) | ORAL | Status: DC | PRN
  Administered 2018-04-23: 650 mg via ORAL
  Filled 2018-04-23: qty 2

## 2018-04-23 NOTE — Anesthesia Postprocedure Evaluation (Signed)
Anesthesia Post Note  Patient: Jason Cook  Procedure(s) Performed: APPENDECTOMY LAPAROSCOPIC HERNIA REPAIR UMBILICAL ADULT (N/A )  Patient location during evaluation: PACU Anesthesia Type: General Level of consciousness: awake and alert Pain management: pain level controlled Vital Signs Assessment: post-procedure vital signs reviewed and stable Respiratory status: spontaneous breathing, nonlabored ventilation, respiratory function stable and patient connected to nasal cannula oxygen Cardiovascular status: blood pressure returned to baseline and stable Postop Assessment: no apparent nausea or vomiting Anesthetic complications: no     Last Vitals:  Vitals:   04/22/18 2116 04/23/18 0531  BP: 106/76 110/73  Pulse: 81 76  Resp: 20 20  Temp: 37 C 37 C  SpO2: 90% 93%    Last Pain:  Vitals:   04/23/18 0531  TempSrc: Oral  PainSc:                  Cleda Mccreedy Brigett Estell

## 2018-04-23 NOTE — Progress Notes (Signed)
Patient ambulated 200 ft around nurses station with minimal difficulty.  Jason Cook

## 2018-04-23 NOTE — Progress Notes (Signed)
Called Dr. Hazle Quant regarding patient's increased work of breathing.  Expiratory wheezing heard and patient is having chills.  Instructed to put patient on 3L of oxygen nasal cannula and decrease maintenance fluids to 40 mL/hr.  Patient had a low grade temp of 99.4.  Gave patient tylenol.  Will continue to monitor.  Jason Cook  04/23/2018  11:46 PM

## 2018-04-23 NOTE — Progress Notes (Signed)
[facility] SURGICAL PROGRESS NOTE  Hospital Day(s): 2.   Post op day(s): 2 Days Post-Op.   Interval History: Patient seen and examined, no acute events or new complaints overnight. Patient reports that he is feeling a little better this morning. He is still reporting RLQ abdominal soreness which is worse with some movements. No reports of fever, chills, nausea, or emesis. NGT with 150 ccs in last 24 hours. He does report passing flatus about 7-8 times today. No reported bowel movement. He is ambulating well and using IS.   Review of Systems:  Constitutional: denies fever, chills  Respiratory: denies any shortness of breath  Cardiovascular: denies chest pain or palpitations  Gastrointestinal: + abdominal pain (RLQ), denied N/V, or diarrhea/and bowel function as per interval history Integumentary: denies any other rashes or skin discolorations except laparoscopic incisions   Vital signs in last 24 hours: [min-max] current  Temp:  [97.7 F (36.5 C)-98.6 F (37 C)] 98.6 F (37 C) (03/31 0531) Pulse Rate:  [75-82] 76 (03/31 0531) Resp:  [18-20] 20 (03/31 0531) BP: (106-128)/(73-87) 110/73 (03/31 0531) SpO2:  [90 %-95 %] 93 % (03/31 0531)     Height: 5\' 10"  (177.8 cm) Weight: 70.3 kg BMI (Calculated): 22.24   Intake/Output this shift:  No intake/output data recorded.      Physical Exam:  Constitutional: alert, cooperative and no distress  Respiratory: breathing non-labored at rest  Cardiovascular: regular rate and sinus rhythm  Gastrointestinal: soft, RLQ tenderness, and non-distended. No rebound/guarding. JP in LLQ draining RLQ space with serosanguinous drainage in bulb Integumentary: Laparoscopic incisions are CDI without erythema or drainage.    Labs:  CBC Latest Ref Rng & Units 04/23/2018 04/22/2018 04/21/2018  WBC 4.0 - 10.5 K/uL 9.9 15.3(H) 16.5(H)  Hemoglobin 13.0 - 17.0 g/dL 11.6(L) 13.1 15.3  Hematocrit 39.0 - 52.0 % 35.1(L) 39.6 43.7  Platelets 150 -  400 K/uL 182 203 253   CMP Latest Ref Rng & Units 04/23/2018 04/22/2018 04/21/2018  Glucose 70 - 99 mg/dL 93 681(E) 751(Z)  BUN 6 - 20 mg/dL 14 12 13   Creatinine 0.61 - 1.24 mg/dL 0.01 7.49 4.49  Sodium 135 - 145 mmol/L 137 134(L) 133(L)  Potassium 3.5 - 5.1 mmol/L 3.4(L) 3.9 3.6  Chloride 98 - 111 mmol/L 104 102 102  CO2 22 - 32 mmol/L 26 25 23   Calcium 8.9 - 10.3 mg/dL 7.9(L) 7.9(L) 8.1(L)  Total Protein 6.5 - 8.1 g/dL - - -  Total Bilirubin 0.3 - 1.2 mg/dL - - -  Alkaline Phos 38 - 126 U/L - - -  AST 15 - 41 U/L - - -  ALT 0 - 44 U/L - - -     Imaging studies: No new pertinent imaging studies   Assessment/Plan: (ICD-10's: K35.80) 53 y.o. male with with resolved leukocytosis and mild new hypokalemia and mild improvement in RLQ abdominal pain 2 Days Post-Op s/p laparoscopic appendectomy and umbilical hernia repair for acute appendicitis   - Will remove NGT today  - Start on clear liquids + IVF for now  - Continue to monitor abdominal pain; on-going bowel function; JP output  - Continue pain control prn; antiemetics prn; albuterol prn  - Rplete K+; monitor   - Medical management of comorbidities             - Mobilization encouraged today             - DVT prophylaxis   All of the above findings and recommendations were discussed  with the patient, and the medical team, and all of patient's questions were answered to his expressed satisfaction.  -- Lynden Oxford, PA-C [facility] 04/23/2018, 7:54 AM [phone] M-F: 7am - 4pm

## 2018-04-24 ENCOUNTER — Inpatient Hospital Stay: Payer: Self-pay

## 2018-04-24 LAB — CBC WITH DIFFERENTIAL/PLATELET
Abs Immature Granulocytes: 0.05 10*3/uL (ref 0.00–0.07)
Basophils Absolute: 0 10*3/uL (ref 0.0–0.1)
Basophils Relative: 0 %
Eosinophils Absolute: 0.3 10*3/uL (ref 0.0–0.5)
Eosinophils Relative: 4 %
HCT: 33.7 % — ABNORMAL LOW (ref 39.0–52.0)
HEMOGLOBIN: 10.8 g/dL — AB (ref 13.0–17.0)
Immature Granulocytes: 1 %
Lymphocytes Relative: 23 %
Lymphs Abs: 1.6 10*3/uL (ref 0.7–4.0)
MCH: 31.3 pg (ref 26.0–34.0)
MCHC: 32 g/dL (ref 30.0–36.0)
MCV: 97.7 fL (ref 80.0–100.0)
MONO ABS: 0.5 10*3/uL (ref 0.1–1.0)
Monocytes Relative: 7 %
Neutro Abs: 4.4 10*3/uL (ref 1.7–7.7)
Neutrophils Relative %: 65 %
Platelets: 198 10*3/uL (ref 150–400)
RBC: 3.45 MIL/uL — ABNORMAL LOW (ref 4.22–5.81)
RDW: 12.4 % (ref 11.5–15.5)
WBC: 6.9 10*3/uL (ref 4.0–10.5)
nRBC: 0 % (ref 0.0–0.2)

## 2018-04-24 LAB — BASIC METABOLIC PANEL
Anion gap: 9 (ref 5–15)
BUN: 12 mg/dL (ref 6–20)
CO2: 24 mmol/L (ref 22–32)
Calcium: 7.8 mg/dL — ABNORMAL LOW (ref 8.9–10.3)
Chloride: 104 mmol/L (ref 98–111)
Creatinine, Ser: 1.03 mg/dL (ref 0.61–1.24)
GFR calc non Af Amer: >60 mL/min (ref >60)
Glucose, Bld: 111 mg/dL — ABNORMAL HIGH (ref 70–99)
Potassium: 3.6 mmol/L (ref 3.5–5.1)
Sodium: 137 mmol/L (ref 135–145)

## 2018-04-24 LAB — SURGICAL PATHOLOGY

## 2018-04-24 LAB — MAGNESIUM: Magnesium: 2 mg/dL (ref 1.7–2.4)

## 2018-04-24 MED ORDER — OXYCODONE HCL 5 MG PO TABS
5.0000 mg | ORAL_TABLET | ORAL | Status: DC | PRN
  Administered 2018-04-24 – 2018-04-25 (×4): 10 mg via ORAL
  Filled 2018-04-24 (×4): qty 2

## 2018-04-24 MED ORDER — ACETAMINOPHEN 500 MG PO TABS: 1000.0000 mg | ORAL_TABLET | Freq: Four times a day (QID) | ORAL | Status: DC | PRN

## 2018-04-24 MED ORDER — HYDROMORPHONE HCL 1 MG/ML IJ SOLN
0.5000 mg | INTRAMUSCULAR | Status: DC | PRN
  Administered 2018-04-25: 0.5 mg via INTRAVENOUS
  Filled 2018-04-24: qty 0.5

## 2018-04-24 MED ORDER — TIOTROPIUM BROMIDE MONOHYDRATE 18 MCG IN CAPS
18.0000 ug | ORAL_CAPSULE | Freq: Every morning | RESPIRATORY_TRACT | Status: DC
  Administered 2018-04-24 – 2018-04-25 (×2): 18 ug via RESPIRATORY_TRACT
  Filled 2018-04-24: qty 5

## 2018-04-24 NOTE — Progress Notes (Signed)
[facility] SURGICAL PROGRESS NOTE  Hospital Day(s): 3.   Post op day(s): 3 Days Post-Op.   Interval History: Patient seen and examined, overnight RN staff reporting increased work of breathing and audible wheezing. CXR was ordered for this morning.   This morning, patient reports his breathing and abdominal pain are better. No complaints of nausea or emesis. He has tolerated a clear liquid diet without issue. Continues to endorse passing gas. No reported bowel movements. JP drain with 130 ccs  Review of Systems:  Constitutional: denies fever, chills  Respiratory: + wheezes Cardiovascular: denies chest pain or palpitations  Gastrointestinal: + abdominal pain, denied N/V, or diarrhea/and bowel function as per interval history Integumentary: denies any other rashes or skin discolorations except laparoscopic incisions   Vital signs in last 24 hours: [min-max] current  Temp:  [97.8 F (36.6 C)-99.4 F (37.4 C)] 98.5 F (36.9 C) (04/01 0742) Pulse Rate:  [62-90] 73 (04/01 0742) Resp:  [20-28] 20 (04/01 0742) BP: (95-147)/(62-98) 146/98 (04/01 0742) SpO2:  [95 %-100 %] 100 % (04/01 0742)     Height: 5\' 10"  (177.8 cm) Weight: 70.3 kg BMI (Calculated): 22.24   Intake/Output this shift:  Total I/O In: 80.7 [I.V.:80.7] Out: 50 [Drains:50]    Physical Exam:  Constitutional: alert, cooperative and no distress  Respiratory: breathing non-labored at rest, wheezes  Cardiovascular: regular rate and sinus rhythm  Gastrointestinal: soft, Improved RLQ tendereness, and non-distended. No rebound/guarding. JP in LLQ draining RLQ space - serosanguinous Integumentary: Laparoscopic incisions are CDI, no erythema or drainage  Labs:  CBC Latest Ref Rng & Units 04/24/2018 04/23/2018 04/22/2018  WBC 4.0 - 10.5 K/uL 6.9 9.9 15.3(H)  Hemoglobin 13.0 - 17.0 g/dL 10.8(L) 11.6(L) 13.1  Hematocrit 39.0 - 52.0 % 33.7(L) 35.1(L) 39.6  Platelets 150 - 400 K/uL 198 182 203   CMP Latest Ref Rng  & Units 04/24/2018 04/23/2018 04/22/2018  Glucose 70 - 99 mg/dL 103(P) 93 594(V)  BUN 6 - 20 mg/dL 12 14 12   Creatinine 0.61 - 1.24 mg/dL 8.59 2.92 4.46  Sodium 135 - 145 mmol/L 137 137 134(L)  Potassium 3.5 - 5.1 mmol/L 3.6 3.4(L) 3.9  Chloride 98 - 111 mmol/L 104 104 102  CO2 22 - 32 mmol/L 24 26 25   Calcium 8.9 - 10.3 mg/dL 7.8(L) 7.9(L) 7.9(L)  Total Protein 6.5 - 8.1 g/dL - - -  Total Bilirubin 0.3 - 1.2 mg/dL - - -  Alkaline Phos 38 - 126 U/L - - -  AST 15 - 41 U/L - - -  ALT 0 - 44 U/L - - -     Imaging studies:   CXR (04/01) personally reviewed and radiologist report reviewed:  IMPRESSION: 1) Stable increased interstitial markings, likely chronic interstitial lung disease. 2) Decreasing lung volumes with increasing bibasilar atelectasis.   Assessment/Plan: (ICD-10's: K62.80) 53 y.o. male with improved wheezing/SOB overnight and mildly improved RLQ abdominal pain 3 Days Post-Op s/p laparoscopic appendectomy and umbilical hernia repairfor acute appendicitis   - Advance to full liquid diet + discontinue IVF   - Continue to monitor abdominal pain; on-going bowel function; JP output             - Continue pain control prn; antiemetics prn; albuterol prn - add spiriva  - Medical management of comorbidities - Mobilization encouraged today - DVT prophylaxis    - Discharge planing: Anticipate discharge tomorrow if continues tolerating diet  All of the above findings and recommendations were discussed with the patient, and the  medical team, and all of patient's questions were answered to his expressed satisfaction.  -- Lynden Oxford, PA-C [facility] 04/24/2018, 8:15 AM [phone] M-F: 7am - 4pm

## 2018-04-25 MED ORDER — OXYCODONE HCL 5 MG PO TABS: 5.0000 mg | ORAL_TABLET | ORAL | 0 refills | Status: DC | PRN

## 2018-04-25 MED ORDER — IBUPROFEN 800 MG PO TABS: 800.0000 mg | ORAL_TABLET | Freq: Three times a day (TID) | ORAL | 0 refills | Status: DC | PRN

## 2018-04-25 MED ORDER — HYDRALAZINE HCL 20 MG/ML IJ SOLN
10.0000 mg | Freq: Once | INTRAMUSCULAR | Status: AC
  Administered 2018-04-25: 10 mg via INTRAVENOUS
  Filled 2018-04-25: qty 1

## 2018-04-25 MED ORDER — AMOXICILLIN-POT CLAVULANATE 875-125 MG PO TABS: 1.0000 | ORAL_TABLET | Freq: Two times a day (BID) | ORAL | 0 refills | Status: AC

## 2018-04-25 MED ORDER — AMOXICILLIN-POT CLAVULANATE 875-125 MG PO TABS: 1.0000 | ORAL_TABLET | Freq: Two times a day (BID) | ORAL | 0 refills | Status: DC

## 2018-04-25 NOTE — Discharge Summary (Addendum)
[facility] SUR[facility]ICAL DISCHARGE SUMMARY  Patient ID: Jason Cook MRN: [id number] DOB/AGE: 52-Aug-1967 52 y.o.  Admit date: 04/21/2018 Discharge date: 04/25/2018  Discharge Diagnoses Patient Active Problem List   Diagnosis Date Noted  . Umbilical hernia without obstruction and without gangrene   . Acute appendicitis 04/21/2018    Consultants None  Procedures 04/25/2018: Laparoscopic Appendectomy   HPI: Jason Cook is a 53 y.o. male presents with a two day history of worsening abdominal pain in the right lower quadrant.  Patient reports that about three days ago he had a mild ache in his abdomen, but went away on its own.  Then last night he had a sudden onset, acute sharp right lower quadrant pain.  This then progressed with nausea and vomiting at home.  Denies any fevers but does reports feeling cold and warm intermittently through the night.  This morning, the pain was spreading more and reports having discomfort in the right scrotum, so he presented to the ED.  Denies any significant pain in the left side of the abdomen.  Denies any chest pain or shortness of breath.    In the ED, had workup including labs and CT scan.  His WBC is elevated at 16.5 with left shift.  His total bilirubin is 1.6, and creatinine is 1.05.  His CT scan shows acute appendicitis with a retrocecal appendix with significant periappendiceal stranding and inflammation.  I have independently viewed the images.  Though there is mention of microperforation, this is not well seen and it's not a significant amount of gas.   Hospital Course: Given the significant swelling and question of perforation on CT conservative management was attempted, however, the night of admission the patient started to develop a fever and the decision was made to go to the OR. nformed consent was obtained and documented, and patient underwent uneventful laparoscopic appendectomy (Dr Aleen Campi, 04/22/2018).  Post-operatively,  patient's had issues with pain, wheezing, and somewhat expected post-surgical ileus. This improved on POD2 and NGT was removed and started on diet. Ambulation was well-tolerated. The remainder of patient's hospital course was essentially unremarkable, and discharge planning was initiated accordingly with patient safely able to be discharged home with appropriate discharge instructions, antibiotics (Augmentin x10 days), pain control, and outpatient follow-up after all of his family's questions were answered to his expressed satisfaction.  The patient will require 4L home O2 for ambulation as he desaturated to 81% on RA with ambulation.   Discharge Condition: Good   Physical Examination:  Constitutional: Well appearing male, NAD Gastrointestinal: Soft, improved RLQ tenderness, non-distended. JP in LLQ draining serosanguinous Skin: Laparoscopic incisions are CDI, no erythema or drainage   Allergies as of 04/25/2018   No Known Allergies     Medication List    TAKE these medications   amoxicillin-clavulanate 875-125 MG tablet Commonly known as:  Augmentin Take 1 tablet by mouth 2 (two) times daily for 10 days.   ibuprofen 800 MG tablet Commonly known as:  ADVIL,MOTRIN Take 1 tablet (800 mg total) by mouth every 8 (eight) hours as needed.   oxyCODONE 5 MG immediate release tablet Commonly known as:  Oxy IR/ROXICODONE Take 1-2 tablets (5-10 mg total) by mouth every 4 (four) hours as needed for moderate pain or severe pain.        Follow-up Information    Henrene Dodge, MD. Go on 05/01/2018.   Specialty:  General Surgery Why:  s/p lap appy, has drain - appointment time 0915 Contact information: [address] [employer]  [address] [place] [address] [phone]            Time spent on discharge management including discussion of hospital course, clinical condition, outpatient instructions, prescriptions, and follow up with the patient and members of the medical team: >30  minutes  -- Lynden Oxford , PA-C [facility]  04/25/2018, 10:22 AM [phone] M-F: 7am - 4pm

## 2018-04-25 NOTE — Discharge Instructions (Signed)
In addition to included general post-operative instructions for laparoscopic appendectomy,  Diet: Resume home diet.   Activity: No heavy lifting >20 pounds (children, pets, laundry, garbage) or strenuous activity until follow-up in 2 weeks, but light activity and walking are encouraged. Do not drive or drink alcohol if taking narcotic pain medications.  Wound care: You may shower/get incision wet with soapy water and pat dry (do not rub incisions), but no baths or submerging incision underwater until follow-up.   Medications: Resume all home medications. For mild to moderate pain: acetaminophen (Tylenol) or ibuprofen/naproxen (if no kidney disease). Combining Tylenol with alcohol can substantially increase your risk of causing liver disease. Narcotic pain medications, if prescribed, can be used for severe pain, though may cause nausea, constipation, and drowsiness. Do not combine Tylenol and Percocet (or similar) within a 6 hour period as Percocet (and similar) contain(s) Tylenol. If you do not need the narcotic pain medication, you do not need to fill the prescription.  Call office [phone] / [phone]) at any time if any questions, worsening pain, fevers/chills, bleeding, drainage from incision site, or other concerns.

## 2018-04-25 NOTE — TOC Transition Note (Signed)
Transition of Care [facility]) - CM/SW Discharge Note   Patient Details  Name: Jason Cook MRN: [id number] Date of Birth: 07-Oct-1965  [facility] CM/SW Contact:  Chapman Fitch, RN Phone Number: 04/25/2018, 2:08 PM   Clinical Narrative:     Patient to discharge home today. Patient lives at home alone.  Patient does not have friends of family that live "locally".  Sister lives in [place]. Spoke with patient's sister on speaker phone while in the room with the patient.  Patient will be discharging to his home today.  Patient declines transport by EMS, and request to go by taxi as there is no one available to pick him up.    PCP Dr Ashok Pall at [facility].  Follow up appointment made for 4/8 Sister transports patient to all appointments, and confirms she will be taking him to follow up appointments.  RNCM faxed rx for Augmentin to [facility] . To be delivered to [facility] and provided to patient at discharge  RX for pain medication sent to [facility] in [place].  Sister to pick up today and deliver to patient tomorrow at his home in [place].   Patient to discharge on charity home O2.  Referral made to [facility] with [facility].  Portable O2 to be delivered to room prior to discharge  Home health order for RN.  Patient and sister both agreeable.  CMS Medicare.gov Compare Post Acute Care list reviewed with patient.  It is Animal nutritionist for [employer].  Referral made to [facility] with [facility].    Final next level of care: Home w Home Health Services Barriers to Discharge: Barriers Resolved   Patient Goals and CMS Choice Patient states their goals for this hospitalization and ongoing recovery are:: "i need yall to give me ride home" CMS Medicare.gov Compare Post Acute Care list provided to:: Patient Choice offered to / list presented to : Patient  Discharge Placement                       Discharge Plan and Services   Discharge Planning Services: CM Consult,  Medication Assistance Post Acute Care Choice: Home Health          DME Arranged: Oxygen DME Agency: [facility] HH Arranged: RN HH Agency: [facility]   Social Determinants of Health (SDOH) Interventions     Readmission Risk Interventions No flowsheet data found.

## 2018-04-25 NOTE — Progress Notes (Addendum)
Patient cleared for discharge. Education complete. AVS printed. Discharge instructions given. All questions answered for patient clarification.  Antibiotics given, sister to pick up pain meds. Oxygen delivered to room. IV removed.  JP drain training complete.  Discharged to home via [employer] (voucher provided)

## 2018-04-25 NOTE — Progress Notes (Signed)
SATURATION QUALIFICATIONS: (This note is used to comply with regulatory documentation for home oxygen)  Patient Saturations on Room Air at Rest = 94%  Patient Saturations on Room Air while Ambulating = 81%  Patient Saturations on 4 Liters of oxygen while Ambulating = 91%  Please briefly explain why patient needs home oxygen:

## 2018-04-26 ENCOUNTER — Telehealth: Payer: Self-pay | Admitting: *Deleted

## 2018-04-26 NOTE — Telephone Encounter (Signed)
Spoke with the patient's sister and she said that the patient does not have a fever today and is only having some pain at the surgical site that at times will travel upwards and is sharp. She says the surgical site is not red or infected looking. This is relieved by pain medication, but she does not want to give him too much. He may alternate extra strength Tylenol with this medication as he cannot take Ibuprofen due to history of ulcer. She says that he is draining pail yellow fluid from his drain with some blood tinged as well. He is urinating alright but has not had a bowel movement since surgery on 04/21/18. Advised her to have the patient start taking Colace twice daily and also to take Miralax one capful in a full glass of water every day until his bowels start moving and then can decrease dosage. She is aware she may call the office with any further questions.

## 2018-04-26 NOTE — Telephone Encounter (Signed)
Patients sister Jason Cook called and stated that he is having sharp pain in his abdomen, he had a fever last night, sweating and chills but was unable to take it but today he has not had one. They are most concern about the amount of pain he is having. Please call and advise

## 2018-05-01 ENCOUNTER — Ambulatory Visit (INDEPENDENT_AMBULATORY_CARE_PROVIDER_SITE_OTHER): Payer: Self-pay | Admitting: Surgery

## 2018-05-01 ENCOUNTER — Other Ambulatory Visit: Payer: Self-pay

## 2018-05-01 ENCOUNTER — Encounter: Payer: Self-pay | Admitting: Surgery

## 2018-05-01 VITALS — BP 129/85 | HR 170 | Temp 97.7°F | Ht 70.0 in | Wt 155.0 lb

## 2018-05-01 DIAGNOSIS — K3580 Unspecified acute appendicitis: Secondary | ICD-10-CM

## 2018-05-01 DIAGNOSIS — Z09 Encounter for follow-up examination after completed treatment for conditions other than malignant neoplasm: Secondary | ICD-10-CM

## 2018-05-01 NOTE — Patient Instructions (Addendum)
Return in two weeks. Video .The patient is aware to call back for any questions or concerns.

## 2018-05-02 ENCOUNTER — Emergency Department: Payer: Self-pay

## 2018-05-02 ENCOUNTER — Emergency Department
Admission: EM | Admit: 2018-05-02 | Discharge: 2018-05-02 | Disposition: A | Discharge status: Home / Self Care | Payer: Self-pay | Attending: Emergency Medicine | Admitting: Emergency Medicine

## 2018-05-02 ENCOUNTER — Encounter: Payer: Self-pay | Admitting: Intensive Care

## 2018-05-02 ENCOUNTER — Other Ambulatory Visit: Payer: Self-pay

## 2018-05-02 DIAGNOSIS — R079 Chest pain, unspecified: Secondary | ICD-10-CM | POA: Insufficient documentation

## 2018-05-02 DIAGNOSIS — E875 Hyperkalemia: Secondary | ICD-10-CM | POA: Insufficient documentation

## 2018-05-02 DIAGNOSIS — F17210 Nicotine dependence, cigarettes, uncomplicated: Secondary | ICD-10-CM | POA: Insufficient documentation

## 2018-05-02 DIAGNOSIS — R0602 Shortness of breath: Secondary | ICD-10-CM | POA: Insufficient documentation

## 2018-05-02 DIAGNOSIS — J449 Chronic obstructive pulmonary disease, unspecified: Secondary | ICD-10-CM | POA: Insufficient documentation

## 2018-05-02 DIAGNOSIS — G8929 Other chronic pain: Secondary | ICD-10-CM | POA: Insufficient documentation

## 2018-05-02 LAB — CBC
HCT: 43.9 % (ref 39.0–52.0)
Hemoglobin: 14.3 g/dL (ref 13.0–17.0)
MCH: 30.9 pg (ref 26.0–34.0)
MCHC: 32.6 g/dL (ref 30.0–36.0)
MCV: 94.8 fL (ref 80.0–100.0)
Platelets: 613 10*3/uL — ABNORMAL HIGH (ref 150–400)
RBC: 4.63 MIL/uL (ref 4.22–5.81)
RDW: 12.2 % (ref 11.5–15.5)
WBC: 10.6 10*3/uL — ABNORMAL HIGH (ref 4.0–10.5)
nRBC: 0 % (ref 0.0–0.2)

## 2018-05-02 LAB — BASIC METABOLIC PANEL
Anion gap: 11 (ref 5–15)
BUN: 17 mg/dL (ref 6–20)
CO2: 21 mmol/L — ABNORMAL LOW (ref 22–32)
Calcium: 9.1 mg/dL (ref 8.9–10.3)
Chloride: 106 mmol/L (ref 98–111)
Creatinine, Ser: 0.87 mg/dL (ref 0.61–1.24)
GFR calc Af Amer: >60 mL/min (ref >60)
GFR calc non Af Amer: >60 mL/min (ref >60)
Glucose, Bld: 96 mg/dL (ref 70–99)
Potassium: 4.4 mmol/L (ref 3.5–5.1)
Sodium: 138 mmol/L (ref 135–145)

## 2018-05-02 LAB — TROPONIN I: Troponin I: <0.03 ng/mL (ref <0.03)

## 2018-05-02 MED ORDER — IOHEXOL 350 MG/ML SOLN
75.0000 mL | Freq: Once | INTRAVENOUS | Status: AC | PRN
  Administered 2018-05-02: 75 mL via INTRAVENOUS

## 2018-05-02 NOTE — Discharge Instructions (Addendum)
Your blood test today were okay, and your potassium level was normal.  In consultation with your primary care doctor, we obtained a CT scan of your chest which was also unremarkable and did not show any blood clots in your lungs.  Please continue to follow-up with your doctor as needed, and continue cutting back on smoking as much as possible with the eventual goal of quitting smoking entirely.  Results for orders placed or performed during the hospital encounter of 05/02/18  Basic metabolic panel  Result Value Ref Range   Sodium 138 135 - 145 mmol/L   Potassium 4.4 3.5 - 5.1 mmol/L   Chloride 106 98 - 111 mmol/L   CO2 21 (L) 22 - 32 mmol/L   Glucose, Bld 96 70 - 99 mg/dL   BUN 17 6 - 20 mg/dL   Creatinine, Ser 4.69 0.61 - 1.24 mg/dL   Calcium 9.1 8.9 - 62.9 mg/dL   GFR calc non Af Amer >60 >60 mL/min   GFR calc Af Amer >60 >60 mL/min   Anion gap 11 5 - 15  CBC  Result Value Ref Range   WBC 10.6 (H) 4.0 - 10.5 K/uL   RBC 4.63 4.22 - 5.81 MIL/uL   Hemoglobin 14.3 13.0 - 17.0 g/dL   HCT 52.8 41.3 - 24.4 %   MCV 94.8 80.0 - 100.0 fL   MCH 30.9 26.0 - 34.0 pg   MCHC 32.6 30.0 - 36.0 g/dL   RDW 01.0 27.2 - 53.6 %   Platelets 613 (H) 150 - 400 K/uL   nRBC 0.0 0.0 - 0.2 %  Troponin I - ONCE - STAT  Result Value Ref Range   Troponin I <0.03 <0.03 ng/mL   Dg Chest 2 View  Result Date: 05/02/2018 CLINICAL DATA:  Chest pain EXAM: CHEST - 2 VIEW COMPARISON:  04/24/2018 FINDINGS: Rounded density noted in the lingula adjacent to the left heart border. Favor scarring, but cannot exclude pulmonary nodule. Heart is normal size. Slight elevation of the right hemidiaphragm. No additional confluent airspace opacities or effusions. IMPRESSION: Rounded nodular density in the lingula adjacent to the left heart border. Favor scarring although I cannot exclude pulmonary nodule. This could be further evaluated with chest CT without contrast. Electronically Signed   By: Charlett Nose M.D.   On: 05/02/2018 12:36    Ct Angio Chest Pe W And/or Wo Contrast  Result Date: 05/02/2018 CLINICAL DATA:  Positive D-dimer study with hyperkalemia. Recent surgery EXAM: CT ANGIOGRAPHY CHEST WITH CONTRAST TECHNIQUE: Multidetector CT imaging of the chest was performed using the standard protocol during bolus administration of intravenous contrast. Multiplanar CT image reconstructions and MIPs were obtained to evaluate the vascular anatomy. CONTRAST:  75mL OMNIPAQUE IOHEXOL 350 MG/ML SOLN COMPARISON:  Chest radiograph May 02, 2018 FINDINGS: Cardiovascular: There is no demonstrable pulmonary embolus. There is no appreciable thoracic aortic aneurysm or dissection. Visualized great vessels appear unremarkable. No pericardial effusion or pericardial thickening is evident. Mediastinum/Nodes: Thyroid appears unremarkable. No adenopathy is appreciable. No esophageal lesions are evident. Lungs/Pleura: There is centrilobular emphysematous change throughout the lungs. There is an area of scarring with rounded atelectatic change in the inferior lingula just lateral to the left heart border, corresponding to the opacity seen on chest radiograph. There is atelectatic change in the right base and posterior segment right upper lobe as well. There is no appreciable edema or consolidation. No pleural effusion evident. Upper Abdomen: Visualized upper abdominal structures appear unremarkable. Musculoskeletal: There are no blastic or lytic  bone lesions. There is a lipoma in the left pectoralis major muscle measuring 1.8 x 1.3 cm. Review of the MIP images confirms the above findings. IMPRESSION: 1. No demonstrable pulmonary embolus. No thoracic aortic aneurysm or dissection. 2. Underlying emphysematous change. Areas of patchy atelectasis on the right. Rounded atelectasis with scarring in the inferior lingula. No consolidation evident. 3.  No appreciable thoracic adenopathy. 4.  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 Emphysema (ICD10-J43.9).  Electronically Signed   By: Bretta BangWilliam  Woodruff III M.D.   On: 05/02/2018 14:45   Ct Abdomen Pelvis W Contrast  Result Date: 04/21/2018 CLINICAL DATA:  Right lower quadrant pain starting last night. Appendicitis suspected. EXAM: CT ABDOMEN AND PELVIS WITH CONTRAST TECHNIQUE: Multidetector CT imaging of the abdomen and pelvis was performed using the standard protocol following bolus administration of intravenous contrast. CONTRAST:  100mL OMNIPAQUE IOHEXOL 300 MG/ML SOLN, <See Chart> ISOVUE-300 IOPAMIDOL (ISOVUE-300) INJECTION 61% COMPARISON:  None. FINDINGS: Lower chest: Emphysema. Right base subsegmental atelectasis. Normal heart size without pericardial or pleural effusion. Tiny hiatal hernia. Hepatobiliary: Normal liver. Normal gallbladder, without biliary ductal dilatation. Pancreas: Normal, without mass or ductal dilatation. Spleen: Normal in size, without focal abnormality. Adrenals/Urinary Tract: Left greater than right adrenal thickening. Bilateral too small to characterize renal lesions, likely cysts. No hydronephrosis. Normal urinary bladder. Stomach/Bowel: Normal remainder of the stomach. Extensive colonic diverticulosis. The terminal ileum is positioned anterior to inflammation including on image 47/2. Appendix: Location: Typical. Diameter: 1.7 cm proximally on image 45/2. Fluid and gas filled distally, including at up to 1.6 cm on image 34/2. Appendicolith: Absent Mucosal hyper-enhancement: Absent Extraluminal gas: Suspected, trace medially on image 41/5 Periappendiceal collection: Moderate periappendiceal edema, without well-circumscribed fluid collection. Normal small bowel. Vascular/Lymphatic: Aortic atherosclerosis. Upper normal ileocolic mesenteric nodes, including at 7 mm on image 38/2. Likely reactive. No abdominal and no pelvic sidewall adenopathy. Reproductive: Normal prostate. Other: Trace cul-de-sac fluid, including on image 67/2. Musculoskeletal: Transitional S1 vertebral body. IMPRESSION:  1. Findings consistent with appendicitis. Possible microperforation, with subtle periappendiceal gas suspected. No extraluminal fluid collection. 2.  Tiny hiatal hernia. 3.  Aortic Atherosclerosis (ICD10-I70.0). 4. Trace cul-de-sac fluid, likely secondary. 5.  Emphysema (ICD10-J43.9). These results were called by telephone at the time of interpretation on 04/21/2018 at 10:46 am to Dr. Sharyn CreamerMARK QUALE , who verbally acknowledged these results. Electronically Signed   By: Jeronimo GreavesKyle  Talbot M.D.   On: 04/21/2018 10:49   Dg Chest Port 1 View  Result Date: 04/24/2018 CLINICAL DATA:  Increased shortness of breath EXAM: PORTABLE CHEST 1 VIEW COMPARISON:  04/21/2018 FINDINGS: Decreasing lung volumes since prior study with bibasilar atelectasis. Increased lung markings/interstitial prominence, similar to prior study. Heart is normal size. No effusions or acute bony abnormality. IMPRESSION: Stable increased interstitial markings, likely chronic interstitial lung disease. Decreasing lung volumes with increasing bibasilar atelectasis. Electronically Signed   By: Charlett NoseKevin  Dover M.D.   On: 04/24/2018 07:56   Dg Chest Port 1 View  Result Date: 04/21/2018 CLINICAL DATA:  Right lower quadrant pain, nausea and vomiting. Hypoxia on room air with history of smoking and COPD. EXAM: PORTABLE CHEST 1 VIEW COMPARISON:  None. FINDINGS: The heart size and mediastinal contours are within normal limits. Bronchial prominence bilaterally specially in the lower lung zones and mild diffuse bilateral interstitial prominence. This likely reflects underlying chronic lung disease. Component active bronchitis cannot be excluded. No overt edema, focal airspace consolidation or pneumothorax identified. The visualized skeletal structures are unremarkable. IMPRESSION: Bronchial and interstitial prominence in the  lungs likely reflects chronic disease. Active bronchitis cannot be excluded. Electronically Signed   By: Irish Lack M.D.   On: 04/21/2018 10:08

## 2018-05-02 NOTE — ED Provider Notes (Signed)
[facility] Emergency Department Provider Note  ____________________________________________  Time seen: Approximately 2:15 PM  I have reviewed the triage vital signs and the nursing notes.   HISTORY  Chief Complaint Abnormal Lab    HPI Jason Cook is a 53 y.o. male with a history of COPD and depression, recently hospitalized with acute appendicitis, who was sent to the ED due to abnormal outpatient labs.  He had a potassium of 6.6.  He also reports chronic chest pain for a year that is not concerned about.  He denies any fevers chills or sweats, only came because his PCP told him to.      Past Medical History:  Diagnosis Date  . COPD (chronic obstructive pulmonary disease) (HCC)   . Depression      Patient Active Problem List   Diagnosis Date Noted  . Umbilical hernia without obstruction and without gangrene   . Acute appendicitis 04/21/2018     Past Surgical History:  Procedure Laterality Date  . APPENDECTOMY    . HERNIA REPAIR    . LAPAROSCOPIC APPENDECTOMY  04/21/2018   Procedure: APPENDECTOMY LAPAROSCOPIC;  Surgeon: Henrene Dodge, MD;  Location: [facility] ORS;  Service: General;;  . UMBILICAL HERNIA REPAIR N/A 04/21/2018   Procedure: HERNIA REPAIR UMBILICAL ADULT;  Surgeon: Henrene Dodge, MD;  Location: [facility] ORS;  Service: General;  Laterality: N/A;     Prior to Admission medications   Medication Sig Start Date End Date Taking? Authorizing Provider  amoxicillin-clavulanate (AUGMENTIN) 875-125 MG tablet Take 1 tablet by mouth 2 (two) times daily for 10 days. 04/25/18 05/05/18  Donovan Kail, PA-C  ibuprofen (ADVIL,MOTRIN) 800 MG tablet Take 1 tablet (800 mg total) by mouth every 8 (eight) hours as needed. 04/25/18   Donovan Kail, PA-C  oxyCODONE (OXY IR/ROXICODONE) 5 MG immediate release tablet Take 1-2 tablets (5-10 mg total) by mouth every 4 (four) hours as needed for moderate pain or severe pain. 04/25/18   Donovan Kail, PA-C      Allergies Ibuprofen   History reviewed. No pertinent family history.  Social History Social History   Tobacco Use  . Smoking status: Current Every Day Smoker    Packs/day: 1.00    Types: Cigarettes  . Smokeless tobacco: Never Used  Substance Use Topics  . Alcohol use: Yes    Comment: weekly  . Drug use: Yes    Types: Marijuana    Comment: weekly    Review of Systems  Constitutional:   No fever or chills.  ENT:   No sore throat. No rhinorrhea. Cardiovascular:   Positive chronic chest pain, unchanged, without syncope. Respiratory:   Positive chronic shortness of breath and occasional nonproductive cough. Gastrointestinal:   Negative for abdominal pain, vomiting and diarrhea.  Musculoskeletal:   Negative for focal pain or swelling All other systems reviewed and are negative except as documented above in ROS and HPI.  ____________________________________________   PHYSICAL EXAM:  VITAL SIGNS: ED Triage Vitals  Enc Vitals Group     BP 05/02/18 1214 (!) 125/92     Pulse Rate 05/02/18 1154 89     Resp 05/02/18 1154 18     Temp 05/02/18 1154 97.9 F (36.6 C)     Temp Source 05/02/18 1154 Oral     SpO2 05/02/18 1154 98 %     Weight 05/02/18 1154 155 lb (70.3 kg)     Height 05/02/18 1154  (1.778 m)     Head Circumference --  Peak Flow --      Pain Score 05/02/18 1154 6     Pain Loc --      Pain Edu? --      Excl. in GC? --     Vital signs reviewed, nursing assessments reviewed.   Constitutional:   Alert and oriented. Non-toxic appearance. Eyes:   Conjunctivae are normal. EOMI. PERRL. ENT      Head:   Normocephalic and atraumatic.      Nose:   No congestion/rhinnorhea.       Mouth/Throat:   MMM, no pharyngeal erythema. No peritonsillar mass.       Neck:   No meningismus. Full ROM. Hematological/Lymphatic/Immunilogical:   No cervical lymphadenopathy. Cardiovascular:   RRR. Symmetric bilateral radial and DP pulses.  No murmurs. Cap refill less  than 2 seconds. Respiratory:   Normal respiratory effort without tachypnea/retractions. Breath sounds are clear and equal bilaterally. No wheezes/rales/rhonchi. Gastrointestinal:   Soft and nontender. Non distended. There is no CVA tenderness.  No rebound, rigidity, or guarding. Musculoskeletal:   Normal range of motion in all extremities. No joint effusions.  No lower extremity tenderness.  No edema. Neurologic:   Normal speech and language.  Motor grossly intact. No acute focal neurologic deficits are appreciated.  Skin:    Skin is warm, dry and intact. No rash noted.  No petechiae, purpura, or bullae.  ____________________________________________    LABS (pertinent positives/negatives) (all labs ordered are listed, but only abnormal results are displayed) Labs Reviewed  BASIC METABOLIC PANEL - Abnormal; Notable for the following components:      Result Value   CO2 21 (*)    All other components within normal limits  CBC - Abnormal; Notable for the following components:   WBC 10.6 (*)    Platelets 613 (*)    All other components within normal limits  TROPONIN I   ____________________________________________   EKG  Interpreted by me  Date: 05/02/2018  Rate: 84  Rhythm: normal sinus rhythm  QRS Axis: normal  Intervals: normal  ST/T Wave abnormalities: normal  Conduction Disutrbances: none  Narrative Interpretation: unremarkable      ____________________________________________    RADIOLOGY  Dg Chest 2 View  Result Date: 05/02/2018 CLINICAL DATA:  Chest pain EXAM: CHEST - 2 VIEW COMPARISON:  04/24/2018 FINDINGS: Rounded density noted in the lingula adjacent to the left heart border. Favor scarring, but cannot exclude pulmonary nodule. Heart is normal size. Slight elevation of the right hemidiaphragm. No additional confluent airspace opacities or effusions. IMPRESSION: Rounded nodular density in the lingula adjacent to the left heart border. Favor scarring although I  cannot exclude pulmonary nodule. This could be further evaluated with chest CT without contrast. Electronically Signed   By: Charlett NoseKevin  Dover M.D.   On: 05/02/2018 12:36   Ct Angio Chest Pe W And/or Wo Contrast  Result Date: 05/02/2018 CLINICAL DATA:  Positive D-dimer study with hyperkalemia. Recent surgery EXAM: CT ANGIOGRAPHY CHEST WITH CONTRAST TECHNIQUE: Multidetector CT imaging of the chest was performed using the standard protocol during bolus administration of intravenous contrast. Multiplanar CT image reconstructions and MIPs were obtained to evaluate the vascular anatomy. CONTRAST:  75mL OMNIPAQUE IOHEXOL 350 MG/ML SOLN COMPARISON:  Chest radiograph May 02, 2018 FINDINGS: Cardiovascular: There is no demonstrable pulmonary embolus. There is no appreciable thoracic aortic aneurysm or dissection. Visualized great vessels appear unremarkable. No pericardial effusion or pericardial thickening is evident. Mediastinum/Nodes: Thyroid appears unremarkable. No adenopathy is appreciable. No esophageal lesions are evident. Lungs/Pleura: There  is centrilobular emphysematous change throughout the lungs. There is an area of scarring with rounded atelectatic change in the inferior lingula just lateral to the left heart border, corresponding to the opacity seen on chest radiograph. There is atelectatic change in the right base and posterior segment right upper lobe as well. There is no appreciable edema or consolidation. No pleural effusion evident. Upper Abdomen: Visualized upper abdominal structures appear unremarkable. Musculoskeletal: There are no blastic or lytic bone lesions. There is a lipoma in the left pectoralis major muscle measuring 1.8 x 1.3 cm. Review of the MIP images confirms the above findings. IMPRESSION: 1. No demonstrable pulmonary embolus. No thoracic aortic aneurysm or dissection. 2. Underlying emphysematous change. Areas of patchy atelectasis on the right. Rounded atelectasis with scarring in the  inferior lingula. No consolidation evident. 3.  No appreciable thoracic adenopathy. 4.  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 Emphysema (ICD10-J43.9). Electronically Signed   By: Bretta Bang III M.D.   On: 05/02/2018 14:45    ____________________________________________   PROCEDURES Procedures  ____________________________________________    CLINICAL IMPRESSION / ASSESSMENT AND PLAN / ED COURSE  Medications ordered in the ED: Medications  iohexol (OMNIPAQUE) 350 MG/ML injection 75 mL (75 mLs Intravenous Contrast Given 05/02/18 1427)    Pertinent labs & imaging results that were available during my care of the patient were reviewed by me and considered in my medical decision making (see chart for details).  Jason Cook was evaluated in Emergency Department on 05/02/2018 for the symptoms described in the history of present illness. He was evaluated in the context of the global COVID-19 pandemic, which necessitated consideration that the patient might be at risk for infection with the SARS-CoV-2 virus that causes COVID-19. Institutional protocols and algorithms that pertain to the evaluation of patients at risk for COVID-19 are in a state of rapid change based on information released by regulatory bodies including the CDC and federal and state organizations. These policies and algorithms were followed during the patient's care in the ED.   Patient arrives for evaluation of hyperkalemia found on outpatient labs.  He denies any acute complaints.  His exam relating to recent appendectomy surgery is reassuring and he is healing as expected.  I will recheck labs today.  Obtain chest x-ray due to his shortness of breath and chest pain.  Clinical Course as of May 01 1544  Thu May 02, 2018  1247 Potassium normal.  No specific symptoms today vital signs unremarkable EKG normal, stable for discharge home and outpatient follow-up as usual.  Potassium: 4.4 [PS]  1413 Discussed with his  primary care doctor who also notes that in addition to the potassium concern there was also a d-dimer which was elevated.  This was checked due to shortness of breath since the patient surgery which he did not report to me.  Given this outpatient finding I will obtain a CT angiogram to evaluate for PE.   [PS]    Clinical Course User Index [PS] Sharman Cheek, MD     ----------------------------------------- 3:46 PM on 05/02/2018 -----------------------------------------  CT scan unremarkable.  Patient still tachypneic which appears to be due to his chronic lung disease.  His primary care doctor suspects that he has COPD due to a 3 pack/day smoking habit for 20 years.  No acute symptoms, labs are okay, stable for outpatient follow-up.  ____________________________________________   FINAL CLINICAL IMPRESSION(S) / ED DIAGNOSES    Final diagnoses:  Chronic chest pain     ED  Discharge Orders    None      Portions of this note were generated with dragon dictation software. Dictation errors may occur despite best attempts at proofreading.   Sharman Cheek, MD 05/02/18 [phone]

## 2018-05-02 NOTE — ED Notes (Signed)
Topaz pad not working.Pt signed physical discharge form.

## 2018-05-02 NOTE — Progress Notes (Signed)
05/02/2018  HPI: Jason Cook is a 53 y.o. male s/p laparoscopic appendectomy on 3/29.  He was discharged on antibiotics and with [facility] drain in place.  He reports doing well and his pain continues to improve.  Denies any nausea, vomiting, fevers, or chills.  Vital signs: BP 129/85   Pulse (!) 170   Temp 97.7 F (36.5 C) (Skin)   Ht 5\' 10"  (1.778 m)   Wt 155 lb (70.3 kg)   SpO2 (!) 86%   BMI 22.24 kg/m    Physical Exam: Constitutional: No acute distress Abdomen:  Soft, non-distended, appropriately sore to palpation at the incisions and right lower quadrant.  Incisions are clean, dry, intact.  Blake drain with serosanguinous fluid.  Assessment/Plan: This is a 53 y.o. male s/p laparoscopic appendectomy.  --Blake drain removed without complications.  Dry dressing applied. --Patient still has 3 weeks of no heavy lifting or pushing of no more than 10-15 lbs. --Follow up in 2 weeks with video virtual appointment.   Howie Ill, MD [facility]

## 2018-05-02 NOTE — ED Triage Notes (Signed)
Says [employer] sent him here for abnormal labs.  Potassium 6.6.  Recent surgery.

## 2018-05-02 NOTE — ED Triage Notes (Signed)
[place] [facility] reported to patient he has high potassium and to come to ER. Labs drawn yesterday. Recently has hernia surgery and appendix removed

## 2018-05-17 ENCOUNTER — Other Ambulatory Visit: Payer: Self-pay

## 2018-05-17 ENCOUNTER — Telehealth (INDEPENDENT_AMBULATORY_CARE_PROVIDER_SITE_OTHER): Payer: Self-pay | Admitting: Surgery

## 2018-05-17 DIAGNOSIS — Z09 Encounter for follow-up examination after completed treatment for conditions other than malignant neoplasm: Secondary | ICD-10-CM

## 2018-05-17 NOTE — Progress Notes (Signed)
Virtual Visit via Telephone Note  I connected with Jason Cook on 05/17/18 at 10:15 AM EDT by telephone and verified that I am speaking with the correct person using two identifiers.   I discussed the limitations, risks, security and privacy concerns of performing an evaluation and management service by telephone and the availability of in person appointments. I also discussed with the patient that there may be a patient responsible charge related to this service. The patient expressed understanding and agreed to proceed.  This service was provided via telemedicine.  The patient consented to the visit being carried via telemedicine.  Patient's location: Home  Provider's location: Office  Referring Provider: None  People participating in this telemedicine visit: Myself, patient  Time spent:  15 minutes   History of Present Illness: This is a 53 year old male status post laparoscopic appendectomy and umbilical hernia repair on 04/22/2018.  He was seen in the office on 05/01/2018 at which point his [facility] drain was removed.  Today we have of telephone appointment to check on him.  He reports that he has been doing very well without any pain, nausea, or vomiting.  He is able to eat well without any constipation or diarrhea and denies any issues with the incisions.   Observations/Objective: Appears to be comfortable in no acute distress and speaking in full sentences without any respiratory distress  Assessment and Plan: 53 year old male status post laparoscopic appendectomy and umbilical hernia repair.  Patient doing very well and is healing appropriately without any complications.  Reviewed pathology report with him again showing appendicitis with no evidence of malignancy.  He will be 4 weeks out of surgery this Sunday at which point he may resume normal activities.  He may follow-up with [place] on an as-needed basis.  Follow Up Instructions:    I discussed the assessment and treatment plan  with the patient. The patient was provided an opportunity to ask questions and all were answered. The patient agreed with the plan and demonstrated an understanding of the instructions.   The patient was advised to call back or seek an in-person evaluation if the symptoms worsen or if the condition fails to improve as anticipated.  I provided 15 minutes of non-face-to-face time during this encounter.   Henrene Dodge, MD

## 2020-05-31 ENCOUNTER — Emergency Department
Admission: EM | Admit: 2020-05-31 | Discharge: 2020-05-31 | Disposition: A | Discharge status: Home / Self Care | Payer: Medicaid Other | Attending: Emergency Medicine | Admitting: Emergency Medicine

## 2020-05-31 ENCOUNTER — Other Ambulatory Visit: Payer: Self-pay

## 2020-05-31 ENCOUNTER — Emergency Department: Payer: Medicaid Other

## 2020-05-31 DIAGNOSIS — R0789 Other chest pain: Secondary | ICD-10-CM | POA: Diagnosis present

## 2020-05-31 DIAGNOSIS — F1010 Alcohol abuse, uncomplicated: Secondary | ICD-10-CM | POA: Insufficient documentation

## 2020-05-31 DIAGNOSIS — I10 Essential (primary) hypertension: Secondary | ICD-10-CM | POA: Insufficient documentation

## 2020-05-31 DIAGNOSIS — Z79899 Other long term (current) drug therapy: Secondary | ICD-10-CM | POA: Diagnosis not present

## 2020-05-31 DIAGNOSIS — J449 Chronic obstructive pulmonary disease, unspecified: Secondary | ICD-10-CM | POA: Insufficient documentation

## 2020-05-31 DIAGNOSIS — R0602 Shortness of breath: Secondary | ICD-10-CM | POA: Diagnosis not present

## 2020-05-31 DIAGNOSIS — Z7951 Long term (current) use of inhaled steroids: Secondary | ICD-10-CM | POA: Diagnosis not present

## 2020-05-31 DIAGNOSIS — F17210 Nicotine dependence, cigarettes, uncomplicated: Secondary | ICD-10-CM | POA: Insufficient documentation

## 2020-05-31 DIAGNOSIS — R110 Nausea: Secondary | ICD-10-CM | POA: Diagnosis not present

## 2020-05-31 DIAGNOSIS — Z720 Tobacco use: Secondary | ICD-10-CM

## 2020-05-31 DIAGNOSIS — R079 Chest pain, unspecified: Secondary | ICD-10-CM

## 2020-05-31 LAB — COMPREHENSIVE METABOLIC PANEL
ALT: 22 U/L (ref 0–44)
AST: 27 U/L (ref 15–41)
Albumin: 4.1 g/dL (ref 3.5–5.0)
Alkaline Phosphatase: 50 U/L (ref 38–126)
Anion gap: 9 (ref 5–15)
BUN: 20 mg/dL (ref 6–20)
CO2: 22 mmol/L (ref 22–32)
Calcium: 8.4 mg/dL — ABNORMAL LOW (ref 8.9–10.3)
Chloride: 107 mmol/L (ref 98–111)
Creatinine, Ser: 1.03 mg/dL (ref 0.61–1.24)
GFR, Estimated: >60 mL/min (ref >60)
Glucose, Bld: 107 mg/dL — ABNORMAL HIGH (ref 70–99)
Potassium: 3.2 mmol/L — ABNORMAL LOW (ref 3.5–5.1)
Sodium: 138 mmol/L (ref 135–145)
Total Bilirubin: 0.7 mg/dL (ref 0.3–1.2)
Total Protein: 6.8 g/dL (ref 6.5–8.1)

## 2020-05-31 LAB — TROPONIN I (HIGH SENSITIVITY)
Troponin I (High Sensitivity): 3 ng/L (ref <18)
Troponin I (High Sensitivity): 3 ng/L (ref <18)

## 2020-05-31 LAB — CBC WITH DIFFERENTIAL/PLATELET
Abs Immature Granulocytes: 0.08 10*3/uL — ABNORMAL HIGH (ref 0.00–0.07)
Basophils Absolute: 0.1 10*3/uL (ref 0.0–0.1)
Basophils Relative: 1 %
Eosinophils Absolute: 0.2 10*3/uL (ref 0.0–0.5)
Eosinophils Relative: 2 %
HCT: 41.3 % (ref 39.0–52.0)
Hemoglobin: 14.4 g/dL (ref 13.0–17.0)
Immature Granulocytes: 1 %
Lymphocytes Relative: 27 %
Lymphs Abs: 3.2 10*3/uL (ref 0.7–4.0)
MCH: 31.4 pg (ref 26.0–34.0)
MCHC: 34.9 g/dL (ref 30.0–36.0)
MCV: 90.2 fL (ref 80.0–100.0)
Monocytes Absolute: 0.8 10*3/uL (ref 0.1–1.0)
Monocytes Relative: 7 %
Neutro Abs: 7.4 10*3/uL (ref 1.7–7.7)
Neutrophils Relative %: 62 %
Platelets: 288 10*3/uL (ref 150–400)
RBC: 4.58 MIL/uL (ref 4.22–5.81)
RDW: 12.1 % (ref 11.5–15.5)
WBC: 11.8 10*3/uL — ABNORMAL HIGH (ref 4.0–10.5)
nRBC: 0 % (ref 0.0–0.2)

## 2020-05-31 LAB — MAGNESIUM: Magnesium: 1.9 mg/dL (ref 1.7–2.4)

## 2020-05-31 MED ORDER — NITROGLYCERIN 0.4 MG SL SUBL: 0.4000 mg | SUBLINGUAL_TABLET | SUBLINGUAL | Status: DC | PRN

## 2020-05-31 MED ORDER — SUCRALFATE 1 G PO TABS
1.0000 g | ORAL_TABLET | Freq: Once | ORAL | Status: AC
  Administered 2020-05-31: 1 g via ORAL
  Filled 2020-05-31: qty 1

## 2020-05-31 MED ORDER — LORAZEPAM 2 MG/ML IJ SOLN
0.0000 mg | Freq: Two times a day (BID) | INTRAMUSCULAR | Status: DC
Start: 2020-06-02 — End: 2020-05-31

## 2020-05-31 MED ORDER — LORAZEPAM 2 MG/ML IJ SOLN: 0.0000 mg | Freq: Four times a day (QID) | INTRAMUSCULAR | Status: DC

## 2020-05-31 MED ORDER — THIAMINE HCL 100 MG PO TABS: 100.0000 mg | ORAL_TABLET | Freq: Every day | ORAL | Status: DC

## 2020-05-31 MED ORDER — LORAZEPAM 2 MG PO TABS: 0.0000 mg | ORAL_TABLET | Freq: Four times a day (QID) | ORAL | Status: DC

## 2020-05-31 MED ORDER — LORAZEPAM 2 MG PO TABS
0.0000 mg | ORAL_TABLET | Freq: Two times a day (BID) | ORAL | Status: DC
Start: 2020-06-02 — End: 2020-05-31

## 2020-05-31 MED ORDER — PANTOPRAZOLE SODIUM 40 MG PO TBEC
40.0000 mg | DELAYED_RELEASE_TABLET | Freq: Once | ORAL | Status: AC
  Administered 2020-05-31: 40 mg via ORAL
  Filled 2020-05-31: qty 1

## 2020-05-31 MED ORDER — POTASSIUM CHLORIDE CRYS ER 20 MEQ PO TBCR
80.0000 meq | EXTENDED_RELEASE_TABLET | Freq: Once | ORAL | Status: AC
  Administered 2020-05-31: 80 meq via ORAL
  Filled 2020-05-31: qty 4

## 2020-05-31 MED ORDER — ALUM & MAG HYDROXIDE-SIMETH 200-200-20 MG/5ML PO SUSP
30.0000 mL | Freq: Once | ORAL | Status: AC
  Administered 2020-05-31: 30 mL via ORAL
  Filled 2020-05-31: qty 30

## 2020-05-31 MED ORDER — THIAMINE HCL 100 MG/ML IJ SOLN: 100.0000 mg | Freq: Every day | INTRAMUSCULAR | Status: DC

## 2020-05-31 NOTE — ED Triage Notes (Signed)
Patient reported via EMS for chest pain beginning at work. Pt C/O centralized stabbing chest pain while working outside. ASA, nitro, fentanyl given by EMS in route.

## 2020-05-31 NOTE — ED Provider Notes (Signed)
[facility] Emergency Department Provider Note  ____________________________________________   Event Date/Time   First MD Initiated Contact with Patient 05/31/20 1248     (approximate)  I have reviewed the triage vital signs and the nursing notes.   HISTORY  Chief Complaint Chest Pain   HPI Jason Cook is a 55 y.o. male with a past medical history of HTN, HDL, COPD, ongoing tobacco abuse, THC use and EtOH abuse drinking approximately 8 beers per day who presents EMS from a place of work for assessment of acute onset of chest pressure associated with shortness of breath and nausea.  Patient states he has had on and off episodes very similar over the last couple of months but today's episode felt particularly severe.  He was working in the garden when it started.  Sounds like the episodes are not always exertional symptoms we will have him rest.      Past Medical History:  Diagnosis Date  . COPD (chronic obstructive pulmonary disease) (HCC)   . Depression     Patient Active Problem List   Diagnosis Date Noted  . Umbilical hernia without obstruction and without gangrene   . Acute appendicitis 04/21/2018    Past Surgical History:  Procedure Laterality Date  . APPENDECTOMY    . HERNIA REPAIR    . LAPAROSCOPIC APPENDECTOMY  04/21/2018   Procedure: APPENDECTOMY LAPAROSCOPIC;  Surgeon: Henrene Dodge, MD;  Location: [facility] ORS;  Service: General;;  . UMBILICAL HERNIA REPAIR N/A 04/21/2018   Procedure: HERNIA REPAIR UMBILICAL ADULT;  Surgeon: Henrene Dodge, MD;  Location: [facility] ORS;  Service: General;  Laterality: N/A;    Prior to Admission medications   Medication Sig Start Date End Date Taking? Authorizing Provider  albuterol (VENTOLIN HFA) 108 (90 Base) MCG/ACT inhaler Inhale 2 puffs into the lungs every 6 (six) hours as needed. 06/05/18  Yes [provider]  buPROPion (WELLBUTRIN SR) 150 MG 12 hr tablet Take 150 mg by mouth in the morning and at  bedtime.   Yes [provider]  fluticasone-salmeterol (ADVAIR HFA) 115-21 MCG/ACT inhaler Inhale 2 puffs into the lungs 2 (two) times daily.   Yes [provider]  hydrochlorothiazide (HYDRODIURIL) 25 MG tablet Take 25 mg by mouth daily.   Yes [provider]  losartan (COZAAR) 25 MG tablet Take 25 mg by mouth daily.   Yes [provider]  tiotropium (SPIRIVA) 18 MCG inhalation capsule Place 18 mcg into inhaler and inhale daily.   Yes [provider]  ibuprofen (ADVIL,MOTRIN) 800 MG tablet Take 1 tablet (800 mg total) by mouth every 8 (eight) hours as needed. Patient not taking: No sig reported 04/25/18   Donovan Kail, PA-C  oxyCODONE (OXY IR/ROXICODONE) 5 MG immediate release tablet Take 1-2 tablets (5-10 mg total) by mouth every 4 (four) hours as needed for moderate pain or severe pain. Patient not taking: No sig reported 04/25/18   Donovan Kail, PA-C    Allergies Ibuprofen  No family history on file.  Social History Social History   Tobacco Use  . Smoking status: Current Every Day Smoker    Packs/day: 1.00    Types: Cigarettes  . Smokeless tobacco: Never Used  Substance Use Topics  . Alcohol use: Yes    Comment: weekly  . Drug use: Yes    Types: Marijuana    Comment: weekly    Review of Systems  Review of Systems  Constitutional: Negative for chills and fever.  HENT: Negative for  sore throat.   Eyes: Negative for pain.  Respiratory: Positive for shortness of breath. Negative for cough and stridor.   Cardiovascular: Positive for chest pain.  Gastrointestinal: Positive for nausea. Negative for vomiting.  Genitourinary: Negative for dysuria.  Musculoskeletal: Negative for myalgias.  Skin: Negative for rash.  Neurological: Negative for seizures, loss of consciousness and headaches.  Psychiatric/Behavioral: Negative for suicidal ideas.  All other systems reviewed and are negative.      ____________________________________________   PHYSICAL EXAM:  VITAL SIGNS: ED Triage Vitals  Enc Vitals Group     BP      Pulse      Resp      Temp      Temp src      SpO2      Weight      Height      Head Circumference      Peak Flow      Pain Score      Pain Loc      Pain Edu?      Excl. in GC?    Vitals:   05/31/20 1430 05/31/20 1457  BP:  130/87  Pulse: 77 74  Resp:  18  Temp:  98.2 F (36.8 C)  SpO2: 100% 98%   Physical Exam Vitals and nursing note reviewed.  Constitutional:      General: He is in acute distress.     Appearance: He is well-developed. He is ill-appearing.  HENT:     Head: Normocephalic and atraumatic.     Right Ear: External ear normal.     Left Ear: External ear normal.     Nose: Nose normal.  Eyes:     Conjunctiva/sclera: Conjunctivae normal.  Cardiovascular:     Rate and Rhythm: Normal rate and regular rhythm.     Heart sounds: No murmur heard.   Pulmonary:     Effort: Pulmonary effort is normal. No respiratory distress.     Breath sounds: Normal breath sounds.  Abdominal:     Palpations: Abdomen is soft.     Tenderness: There is no abdominal tenderness.  Musculoskeletal:     Cervical back: Neck supple.     Right lower leg: No edema.     Left lower leg: No edema.  Skin:    General: Skin is warm and dry.  Neurological:     Mental Status: He is alert and oriented to person, place, and time.  Psychiatric:        Mood and Affect: Mood normal.      ____________________________________________   LABS (all labs ordered are listed, but only abnormal results are displayed)  Labs Reviewed  CBC WITH DIFFERENTIAL/PLATELET - Abnormal; Notable for the following components:      Result Value   WBC 11.8 (*)    Abs Immature Granulocytes 0.08 (*)    All other components within normal limits  COMPREHENSIVE METABOLIC PANEL - Abnormal; Notable for the following components:   Potassium 3.2 (*)    Glucose, Bld 107 (*)     Calcium 8.4 (*)    All other components within normal limits  MAGNESIUM  TROPONIN I (HIGH SENSITIVITY)  TROPONIN I (HIGH SENSITIVITY)   ____________________________________________  EKG  Sinus rhythm with a ventricular rate of 90, normal axis, unremarkable intervals without clear evidence of acute ischemia or other significant underlying arrhythmia.  Missing V2. ____________________________________________  RADIOLOGY  ED MD interpretation: No focal toleration, effusion, significant edema, pneumothorax or other clear acute intrathoracic process.  Official radiology report(s): DG Chest 2 View  Result Date: 05/31/2020 CLINICAL DATA:  Chest pain. EXAM: CHEST - 2 VIEW COMPARISON:  05/02/2018 chest radiograph and CT chest. FINDINGS: Trachea is midline. Heart size normal. Minimal streaky scarring in the lung bases. Lungs are otherwise clear. No pleural fluid. IMPRESSION: No acute findings. Electronically Signed   By: Leanna Battles M.D.   On: 05/31/2020 13:25    ____________________________________________   PROCEDURES  Procedure(s) performed (including Critical Care):  .1-3 Lead EKG Interpretation Performed by: Gilles Chiquito, MD Authorized by: Gilles Chiquito, MD     Interpretation: normal     ECG rate assessment: normal     Rhythm: sinus rhythm     Ectopy: none     Conduction: normal       ____________________________________________   INITIAL IMPRESSION / ASSESSMENT AND PLAN / ED COURSE        Patient presents with above to history exam for assessment of acute onset of some chest pressure described above.  On arrival he is afebrile and hemodynamically stable.  He did receive 324 mg of ASA as well as sublingual nitro and 100 mcg of fentanyl by EMS prior arrival.    Differential includes ACS, arrhythmia, pneumonia, thorax, PE, gastritis, chest wall etiologies, pericarditis, myocarditis and GI etiologies.  ECG shows no clear evidence of ischemia or underlying  arrhythmia and while initial troponin is nonelevated we will plan to obtain a 2-hour repeat given patient's history and risk factors for ACS.  Chest x-ray has no evidence of pneumonia pneumothorax edema or other clear acute intrathoracic process.  Overall very low suspicion for PE given description of symptoms without any evidence of tachycardia or tachypnea hypoxia or any history of recent surgery, hemoptysis, unilateral leg swelling, or history of DVT/PE.  CBC shows WBC count of 11.8 which is somewhat nonspecific.  Hemoglobin and platelets are normal.  CMP remarkable for K of 3.2 which was repleted.  No other significant electrolyte or metabolic derangements.  Exam WNL.  Patient given GI cocktail with Carafate and Maalox and Protonix and on reassessment stated he felt much improved.  I suspect patient might have some reflux or gastritis which he is at high risk for from his alcohol abuse.  Counseled him on this.  Care patient signed over to undergoing further approximately 1500.  Plan is to follow-up repeat troponin and if nonelevated patient states he is still in pain on reassessment he would like to be safe for discharge with outpatient follow-up.  ____________________________________________   FINAL CLINICAL IMPRESSION(S) / ED DIAGNOSES  Final diagnoses:  Chest pain, unspecified type  Tobacco abuse  Alcohol abuse    Medications  nitroGLYCERIN (NITROSTAT) SL tablet 0.4 mg (has no administration in time range)  LORazepam (ATIVAN) injection 0-4 mg (0 mg Intravenous Not Given 05/31/20 1427)    Or  LORazepam (ATIVAN) tablet 0-4 mg ( Oral See Alternative 05/31/20 1427)  LORazepam (ATIVAN) injection 0-4 mg (has no administration in time range)    Or  LORazepam (ATIVAN) tablet 0-4 mg (has no administration in time range)  thiamine tablet 100 mg (100 mg Oral Not Given 05/31/20 1428)    Or  thiamine (B-1) injection 100 mg ( Intravenous See Alternative 05/31/20 1428)  potassium chloride SA (KLOR-CON)  CR tablet 80 mEq (80 mEq Oral Given 05/31/20 1405)  alum & mag hydroxide-simeth (MAALOX/MYLANTA) 200-200-20 MG/5ML suspension 30 mL (30 mLs Oral Given 05/31/20 1405)  sucralfate (CARAFATE) tablet 1 g (1 g Oral Given 05/31/20  1405)  pantoprazole (PROTONIX) EC tablet 40 mg (40 mg Oral Given 05/31/20 1405)     ED Discharge Orders    None       Note:  This document was prepared using Dragon voice recognition software and may include unintentional dictation errors.   Gilles Chiquito, MD 05/31/20 [phone]

## 2020-05-31 NOTE — ED Notes (Signed)
Patients IV infiltrated. Removed.

## 2020-05-31 NOTE — ED Notes (Signed)
Provided DC instructions. Verbalized understanding.  

## 2020-07-02 IMAGING — DX PORTABLE CHEST - 1 VIEW
1 series · 1 of 1 positions shown · non-contrast
Comparison: None.

CLINICAL DATA: Right lower quadrant pain, nausea and vomiting.
Hypoxia on room air with history of smoking and COPD.

EXAM:
PORTABLE CHEST 1 VIEW

[chest ap]
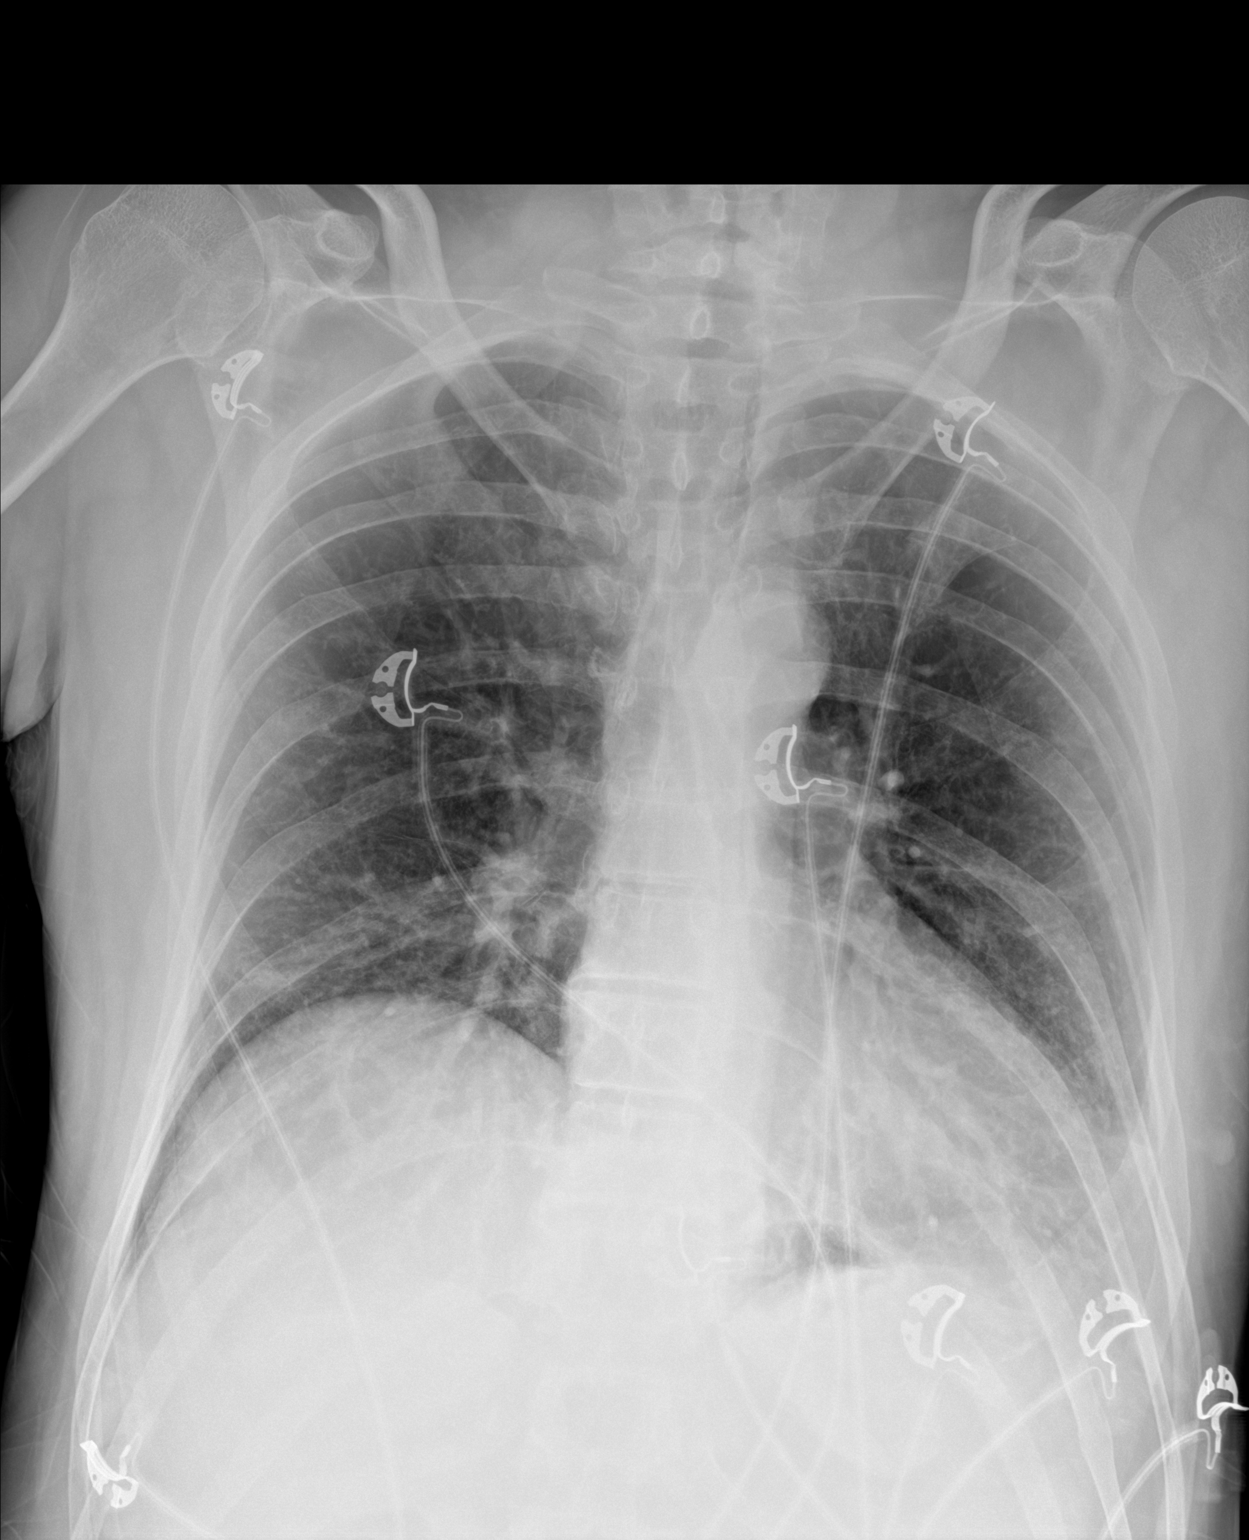

[1 of 1 positions shown; findings below may reference images not displayed]

FINDINGS: The heart size and mediastinal contours are within normal limits.
Bronchial prominence bilaterally specially in the lower lung zones
and mild diffuse bilateral interstitial prominence. This likely
reflects underlying chronic lung disease. Component active
bronchitis cannot be excluded. No overt edema, focal airspace
consolidation or pneumothorax identified. The visualized skeletal
structures are unremarkable.
IMPRESSION: Bronchial and interstitial prominence in the lungs likely reflects
chronic disease. Active bronchitis cannot be excluded.

## 2020-07-05 IMAGING — DX PORTABLE CHEST - 1 VIEW
1 series · 1 of 1 positions shown · non-contrast
Comparison: 04/21/2018

CLINICAL DATA: Increased shortness of breath

EXAM:
PORTABLE CHEST 1 VIEW

[chest ap]
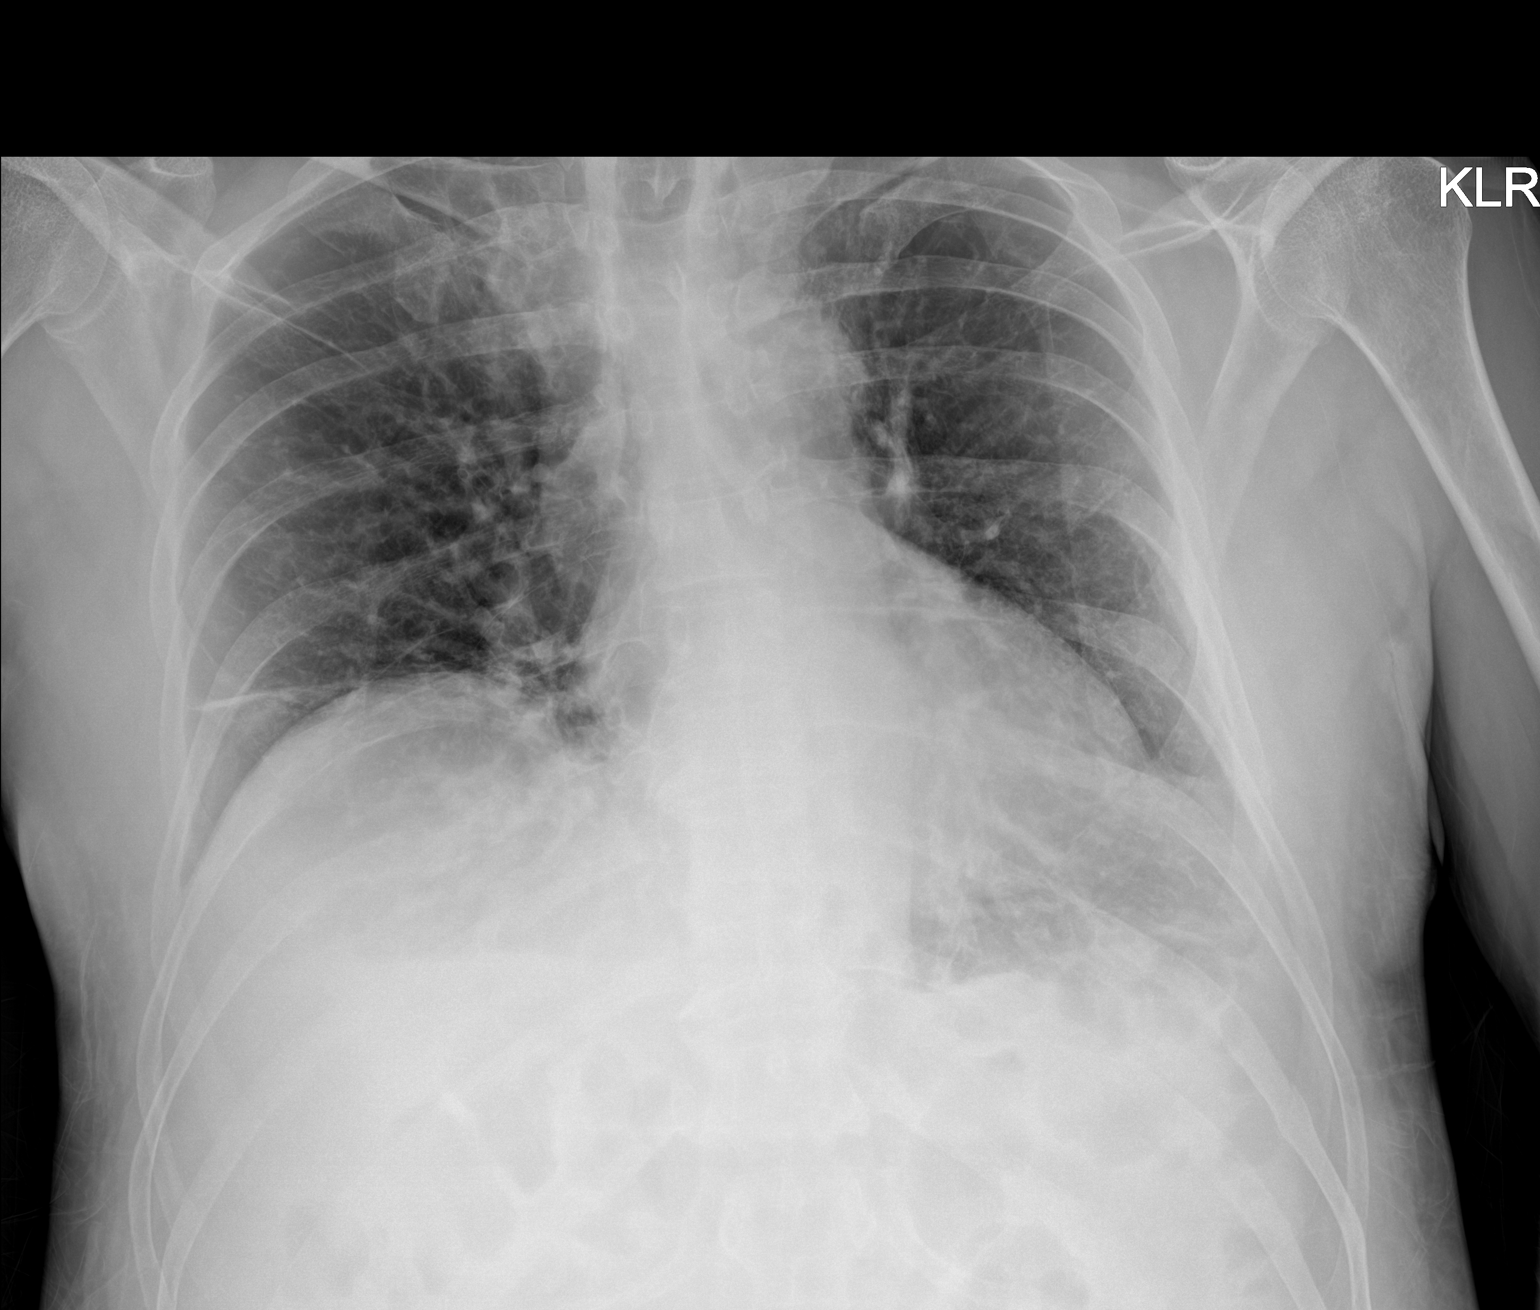

[1 of 1 positions shown; findings below may reference images not displayed]

FINDINGS: Decreasing lung volumes since prior study with bibasilar
atelectasis. Increased lung markings/interstitial prominence,
similar to prior study. Heart is normal size. No effusions or acute
bony abnormality.
IMPRESSION: Stable increased interstitial markings, likely chronic interstitial
lung disease.

Decreasing lung volumes with increasing bibasilar atelectasis.

## 2020-07-12 ENCOUNTER — Ambulatory Visit: Payer: Self-pay | Admitting: Family Medicine

## 2020-11-05 ENCOUNTER — Ambulatory Visit (INDEPENDENT_AMBULATORY_CARE_PROVIDER_SITE_OTHER): Payer: Medicare Other | Admitting: Family Medicine

## 2020-11-05 ENCOUNTER — Encounter: Payer: Self-pay | Admitting: Family Medicine

## 2020-11-05 ENCOUNTER — Other Ambulatory Visit: Payer: Self-pay

## 2020-11-05 VITALS — BP 142/94 | HR 80 | Ht 70.0 in | Wt 188.0 lb

## 2020-11-05 DIAGNOSIS — I10 Essential (primary) hypertension: Secondary | ICD-10-CM

## 2020-11-05 DIAGNOSIS — Z7689 Persons encountering health services in other specified circumstances: Secondary | ICD-10-CM | POA: Diagnosis not present

## 2020-11-05 DIAGNOSIS — J432 Centrilobular emphysema: Secondary | ICD-10-CM

## 2020-11-05 DIAGNOSIS — K219 Gastro-esophageal reflux disease without esophagitis: Secondary | ICD-10-CM

## 2020-11-05 DIAGNOSIS — E7801 Familial hypercholesterolemia: Secondary | ICD-10-CM

## 2020-11-05 MED ORDER — LOSARTAN POTASSIUM 50 MG PO TABS
50.0000 mg | ORAL_TABLET | Freq: Every day | ORAL | 1 refills | Status: DC
Start: 2020-11-05 — End: 2021-01-19

## 2020-11-05 MED ORDER — TIOTROPIUM BROMIDE MONOHYDRATE 18 MCG IN CAPS: 18.0000 ug | ORAL_CAPSULE | Freq: Every day | RESPIRATORY_TRACT | 1 refills | Status: DC

## 2020-11-05 MED ORDER — ALBUTEROL SULFATE HFA 108 (90 BASE) MCG/ACT IN AERS: 2.0000 | INHALATION_SPRAY | Freq: Four times a day (QID) | RESPIRATORY_TRACT | 1 refills | Status: DC | PRN

## 2020-11-05 MED ORDER — HYDROCHLOROTHIAZIDE 25 MG PO TABS: 25.0000 mg | ORAL_TABLET | Freq: Every day | ORAL | 1 refills | Status: DC

## 2020-11-05 MED ORDER — ADVAIR HFA 115-21 MCG/ACT IN AERO: 2.0000 | INHALATION_SPRAY | Freq: Two times a day (BID) | RESPIRATORY_TRACT | 1 refills | Status: DC

## 2020-11-05 MED ORDER — ATORVASTATIN CALCIUM 40 MG PO TABS: 40.0000 mg | ORAL_TABLET | Freq: Every day | ORAL | 1 refills | Status: DC | PRN

## 2020-11-05 MED ORDER — PANTOPRAZOLE SODIUM 40 MG PO TBEC: 40.0000 mg | DELAYED_RELEASE_TABLET | Freq: Every day | ORAL | 1 refills | Status: AC

## 2020-11-05 NOTE — Progress Notes (Signed)
Date:  11/05/2020   Name:  Jason Cook   DOB:  11/04/1965   MRN:  [id number]   Chief Complaint: Establish Care (Easier to come here)  HPI  Lab Results  Component Value Date   CREATININE 1.03 05/31/2020   BUN 20 05/31/2020   NA 138 05/31/2020   K 3.2 (L) 05/31/2020   CL 107 05/31/2020   CO2 22 05/31/2020   No results found for: CHOL, HDL, LDLCALC, LDLDIRECT, TRIG, CHOLHDL No results found for: TSH No results found for: HGBA1C Lab Results  Component Value Date   WBC 11.8 (H) 05/31/2020   HGB 14.4 05/31/2020   HCT 41.3 05/31/2020   MCV 90.2 05/31/2020   PLT 288 05/31/2020   Lab Results  Component Value Date   ALT 22 05/31/2020   AST 27 05/31/2020   ALKPHOS 50 05/31/2020   BILITOT 0.7 05/31/2020     Review of Systems  Constitutional:  Negative for chills and fever.  HENT:  Negative for drooling, ear discharge, ear pain and sore throat.   Respiratory:  Negative for cough, shortness of breath and wheezing.   Cardiovascular:  Negative for chest pain, palpitations and leg swelling.  Gastrointestinal:  Negative for abdominal pain, blood in stool, constipation, diarrhea and nausea.  Endocrine: Negative for polydipsia.  Genitourinary:  Negative for dysuria, frequency, hematuria and urgency.  Musculoskeletal:  Negative for back pain, myalgias and neck pain.  Skin:  Negative for rash.  Allergic/Immunologic: Negative for environmental allergies.  Neurological:  Negative for dizziness and headaches.  Hematological:  Does not bruise/bleed easily.  Psychiatric/Behavioral:  Negative for suicidal ideas. The patient is not nervous/anxious.    Patient Active Problem List   Diagnosis Date Noted   Umbilical hernia without obstruction and without gangrene    Acute appendicitis 04/21/2018    Allergies  Allergen Reactions   Ibuprofen     Past Surgical History:  Procedure Laterality Date   APPENDECTOMY     collapse lung N/A 2015   HERNIA REPAIR     LAPAROSCOPIC  APPENDECTOMY  04/21/2018   Procedure: APPENDECTOMY LAPAROSCOPIC;  Surgeon: Henrene Dodge, MD;  Location: [facility] ORS;  Service: General;;   UMBILICAL HERNIA REPAIR N/A 04/21/2018   Procedure: HERNIA REPAIR UMBILICAL ADULT;  Surgeon: Henrene Dodge, MD;  Location: [facility] ORS;  Service: General;  Laterality: N/A;    Social History   Tobacco Use   Smoking status: Every Day    Packs/day: 0.50    Years: 42.00    Pack years: 21.00    Types: Cigarettes   Smokeless tobacco: Never  Substance Use Topics   Alcohol use: Yes    Alcohol/week: 8.0 standard drinks    Types: 6 Cans of beer, 2 Shots of liquor per week    Comment: daily   Drug use: Yes    Types: Marijuana    Comment: daily     Medication list has been reviewed and updated.  Current Meds  Medication Sig   albuterol (VENTOLIN HFA) 108 (90 Base) MCG/ACT inhaler Inhale 2 puffs into the lungs every 6 (six) hours as needed.   atorvastatin (LIPITOR) 40 MG tablet Take 40 mg by mouth daily as needed.   fluticasone-salmeterol (ADVAIR HFA) 115-21 MCG/ACT inhaler Inhale 2 puffs into the lungs 2 (two) times daily.   hydrochlorothiazide (HYDRODIURIL) 25 MG tablet Take 25 mg by mouth daily.   losartan (COZAAR) 25 MG tablet Take 25 mg by mouth daily.   oxyCODONE (OXY IR/ROXICODONE) 5 MG immediate release  tablet Take 1-2 tablets (5-10 mg total) by mouth every 4 (four) hours as needed for moderate pain or severe pain.   pantoprazole (PROTONIX) 40 MG tablet Take 40 mg by mouth daily.   tiotropium (SPIRIVA) 18 MCG inhalation capsule Place 18 mcg into inhaler and inhale daily.    PHQ 2/9 Scores 11/05/2020  PHQ - 2 Score 0  PHQ- 9 Score 5    GAD 7 : Generalized Anxiety Score 11/05/2020  Nervous, Anxious, on Edge 0  Control/stop worrying 2  Worry too much - different things 0  Trouble relaxing 0  Restless 0  Easily annoyed or irritable 0  Afraid - awful might happen 0  Total GAD 7 Score 2  Anxiety Difficulty Not difficult at all    BP  Readings from Last 3 Encounters:  11/05/20 (!) 142/94  05/31/20 132/82  05/02/18 117/83    Physical Exam Vitals and nursing note reviewed.  HENT:     Head: Normocephalic.     Right Ear: Tympanic membrane and external ear normal. There is no impacted cerumen.     Left Ear: Tympanic membrane and external ear normal. There is no impacted cerumen.     Nose: Nose normal.  Eyes:     General: No scleral icterus.       Right eye: No discharge.        Left eye: No discharge.     Conjunctiva/sclera: Conjunctivae normal.     Pupils: Pupils are equal, round, and reactive to light.  Neck:     Thyroid: No thyromegaly.     Vascular: No JVD.     Trachea: No tracheal deviation.  Cardiovascular:     Rate and Rhythm: Normal rate and regular rhythm.     Heart sounds: Normal heart sounds. No murmur heard.   No friction rub. No gallop.  Pulmonary:     Effort: No respiratory distress.     Breath sounds: Normal breath sounds. No wheezing, rhonchi or rales.  Abdominal:     General: Bowel sounds are normal.     Palpations: Abdomen is soft. There is no mass.     Tenderness: There is no abdominal tenderness. There is no guarding or rebound.  Musculoskeletal:        General: No tenderness. Normal range of motion.     Cervical back: Normal range of motion and neck supple.  Lymphadenopathy:     Cervical: No cervical adenopathy.  Skin:    General: Skin is warm.     Findings: No rash.  Neurological:     Mental Status: He is alert and oriented to person, place, and time.     Cranial Nerves: No cranial nerve deficit.     Deep Tendon Reflexes: Reflexes are normal and symmetric.    Wt Readings from Last 3 Encounters:  11/05/20 188 lb (85.3 kg)  05/31/20 154 lb 5.2 oz (70 kg)  05/02/18 155 lb (70.3 kg)    BP (!) 142/94   Pulse 80   Ht 5\' 10"  (1.778 m)   Wt 188 lb (85.3 kg)   BMI 26.98 kg/m   Assessment and Plan: 1. Establishing care with new doctor, encounter for Patient establishing care  with new physician because it is closer to his home.  For the most part patient needs refills on all his medications and likely an adjustment on his blood pressure.  2. Centrilobular emphysema (HCC) Chronic.  Persistent.  Stable.  Patient continues to smoke both tobacco and marijuana.  It  is highly unlikely that this is going to stop and we will refill his Spiriva, Advair, Ventolin for improvement of his pulmonary function. - tiotropium (SPIRIVA) 18 MCG inhalation capsule; Place 1 capsule (18 mcg total) into inhaler and inhale daily.  Dispense: 30 capsule; Refill: 1 - fluticasone-salmeterol (ADVAIR HFA) 115-21 MCG/ACT inhaler; Inhale 2 puffs into the lungs 2 (two) times daily.  Dispense: 1 each; Refill: 1 - albuterol (VENTOLIN HFA) 108 (90 Base) MCG/ACT inhaler; Inhale 2 puffs into the lungs every 6 (six) hours as needed.  Dispense: 8 g; Refill: 1  3. Gastroesophageal reflux disease, unspecified whether esophagitis present Chronic.  Controlled.  Stable.  Patient states that he needs to be on Protonix and this will be continued 40 mg once a day. - pantoprazole (PROTONIX) 40 MG tablet; Take 1 tablet (40 mg total) by mouth daily.  Dispense: 90 tablet; Refill: 1  4. Primary hypertension Chronic.  Uncontrolled.  Stable.  Blood pressure is elevated at 142/94.  We will continue the hydrochlorothiazide at 25 mg but increase his losartan to 50 mg a day.  We will recheck patient in 3 to 4 weeks for blood pressure adjustment. - hydrochlorothiazide (HYDRODIURIL) 25 MG tablet; Take 1 tablet (25 mg total) by mouth daily.  Dispense: 30 tablet; Refill: 1  5. Familial hypercholesterolemia Chronic.  Controlled.  Stable.  Continue atorvastatin 40 mg once a day. - atorvastatin (LIPITOR) 40 MG tablet; Take 1 tablet (40 mg total) by mouth daily as needed.  Dispense: 30 tablet; Refill: 1

## 2020-11-27 ENCOUNTER — Other Ambulatory Visit: Payer: Self-pay | Admitting: Family Medicine

## 2020-11-27 DIAGNOSIS — J432 Centrilobular emphysema: Secondary | ICD-10-CM

## 2020-11-27 NOTE — Telephone Encounter (Signed)
Pharmacy requesting an alternative med to be prescribed. Med not assigned to a protocol. Pharmacy comment: Alternative Requested:NONFORMULARY, INS PREFERS INCRUSE PLEASE SEND RX IF APPROPRIATE TO CHANGE  Requested Prescriptions  Pending Prescriptions Disp Refills   umeclidinium bromide (INCRUSE ELLIPTA) 62.5 MCG/ACT AEPB [Pharmacy Med Name: INCRUSE ELLIPTA 62.5 MCG INH]  0     Off-Protocol Failed - 11/27/2020  9:09 AM      Failed - Medication not assigned to a protocol, review manually.      Passed - Valid encounter within last 12 months    Recent Outpatient Visits           3 weeks ago Establishing care with new doctor, encounter for   [facility] Duanne Limerick, MD       Future Appointments             In 5 days Duanne Limerick, MD [facility], [facility]

## 2020-12-02 ENCOUNTER — Ambulatory Visit: Payer: Medicare Other | Admitting: Family Medicine

## 2021-01-18 ENCOUNTER — Other Ambulatory Visit: Payer: Self-pay | Admitting: Family Medicine

## 2021-01-18 DIAGNOSIS — J432 Centrilobular emphysema: Secondary | ICD-10-CM

## 2021-01-18 DIAGNOSIS — E7801 Familial hypercholesterolemia: Secondary | ICD-10-CM

## 2021-01-18 DIAGNOSIS — I10 Essential (primary) hypertension: Secondary | ICD-10-CM

## 2021-01-19 NOTE — Telephone Encounter (Signed)
Requested medications are due for refill today.  yes  Requested medications are on the active medications list.  yes  Last refill. 11/05/2020 for all 3  Future visit scheduled.   no  Notes to clinic.  Pt seen on 11/05/2020 to establish care. Pt was to rtc in 2 months for follow up (around 11/26/2020). Pt no showed on 12/02/2020. Please advise.    Requested Prescriptions  Pending Prescriptions Disp Refills   albuterol (VENTOLIN HFA) 108 (90 Base) MCG/ACT inhaler [Pharmacy Med Name: ALBUTEROL HFA (PROAIR) INHALER] 8.5 each 1    Sig: INHALE 2 PUFFS INTO THE LUNGS EVERY 6 HOURS AS NEEDED     Pulmonology:  Beta Agonists Failed - 01/18/2021  2:32 PM      Failed - One inhaler should last at least one month. If the patient is requesting refills earlier, contact the patient to check for uncontrolled symptoms.      Passed - Valid encounter within last 12 months    Recent Outpatient Visits           2 months ago Establishing care with new doctor, encounter for   [facility] Duanne Limerick, MD               atorvastatin (LIPITOR) 40 MG tablet [Pharmacy Med Name: ATORVASTATIN 40 MG TABLET] 30 tablet 1    Sig: TAKE 1 TABLET BY MOUTH DAILY AS NEEDED     Cardiovascular:  Antilipid - Statins Failed - 01/18/2021  2:32 PM      Failed - Total Cholesterol in normal range and within 360 days    No results found for: CHOL, POCCHOL, CHOLTOT        Failed - LDL in normal range and within 360 days    No results found for: LDLCALC, LDLC, HIRISKLDL, POCLDL, LDLDIRECT, REALLDLC, TOTLDLC        Failed - HDL in normal range and within 360 days    No results found for: HDL, POCHDL        Failed - Triglycerides in normal range and within 360 days    No results found for: TRIG, POCTRIG        Passed - Patient is not pregnant      Passed - Valid encounter within last 12 months    Recent Outpatient Visits           2 months ago Establishing care with new doctor, encounter for    [facility] Duanne Limerick, MD               losartan (COZAAR) 50 MG tablet [Pharmacy Med Name: LOSARTAN POTASSIUM 50 MG TAB] 30 tablet 1    Sig: TAKE 1 TABLET BY MOUTH EVERY DAY     Cardiovascular:  Angiotensin Receptor Blockers Failed - 01/18/2021  2:32 PM      Failed - Cr in normal range and within 180 days    Creatinine, Ser  Date Value Ref Range Status  05/31/2020 1.03 0.61 - 1.24 mg/dL Final          Failed - K in normal range and within 180 days    Potassium  Date Value Ref Range Status  05/31/2020 3.2 (L) 3.5 - 5.1 mmol/L Final          Failed - Last BP in normal range    BP Readings from Last 1 Encounters:  11/05/20 (!) 142/94          Passed - Patient is not  pregnant      Passed - Valid encounter within last 6 months    Recent Outpatient Visits           2 months ago Establishing care with new doctor, encounter for   [facility] Duanne Limerick, MD               hydrochlorothiazide (HYDRODIURIL) 25 MG tablet [Pharmacy Med Name: HYDROCHLOROTHIAZIDE 25 MG TAB] 30 tablet 1    Sig: Take 1 tablet (25 mg total) by mouth daily.     Cardiovascular: Diuretics - Thiazide Failed - 01/18/2021  2:32 PM      Failed - Ca in normal range and within 360 days    Calcium  Date Value Ref Range Status  05/31/2020 8.4 (L) 8.9 - 10.3 mg/dL Final          Failed - K in normal range and within 360 days    Potassium  Date Value Ref Range Status  05/31/2020 3.2 (L) 3.5 - 5.1 mmol/L Final          Failed - Last BP in normal range    BP Readings from Last 1 Encounters:  11/05/20 (!) 142/94          Passed - Cr in normal range and within 360 days    Creatinine, Ser  Date Value Ref Range Status  05/31/2020 1.03 0.61 - 1.24 mg/dL Final          Passed - Na in normal range and within 360 days    Sodium  Date Value Ref Range Status  05/31/2020 138 135 - 145 mmol/L Final          Passed - Valid encounter within last 6 months     Recent Outpatient Visits           2 months ago Establishing care with new doctor, encounter for   [facility] Duanne Limerick, MD

## 2021-01-22 ENCOUNTER — Other Ambulatory Visit: Payer: Self-pay | Admitting: Family Medicine

## 2021-01-22 DIAGNOSIS — J432 Centrilobular emphysema: Secondary | ICD-10-CM

## 2021-01-22 NOTE — Telephone Encounter (Signed)
Requested medication (s) are due for refill today: filled 12/24/20  Requested medication (s) are on the active medication list: yes  Last refill:  12/24/20  Future visit scheduled: no, new pt appt 11/05/20, NO SHOW 12/02/20, no upcoming visit scheduled.  Notes to clinic:  new pt established 11/05/20, please assess.  Requested Prescriptions  Pending Prescriptions Disp Refills   ADVAIR HFA 115-21 MCG/ACT inhaler [Pharmacy Med Name: ADVAIR HFA 115-21 MCG INHALER] 12 each 1    Sig: INHALE 2 PUFFS INTO THE LUNGS TWICE A DAY     Pulmonology:  Combination Products Passed - 01/22/2021  2:23 AM      Passed - Valid encounter within last 12 months    Recent Outpatient Visits           2 months ago Establishing care with new doctor, encounter for   [facility] Duanne Limerick, MD

## 2021-02-14 ENCOUNTER — Other Ambulatory Visit: Payer: Self-pay | Admitting: Family Medicine

## 2021-02-14 DIAGNOSIS — J432 Centrilobular emphysema: Secondary | ICD-10-CM

## 2021-02-14 NOTE — Telephone Encounter (Signed)
Requested Prescriptions  Pending Prescriptions Disp Refills   albuterol (VENTOLIN HFA) 108 (90 Base) MCG/ACT inhaler [Pharmacy Med Name: ALBUTEROL HFA (PROAIR) INHALER] 8.5 each 0    Sig: INHALE 2 PUFFS INTO THE LUNGS EVERY 6 HOURS AS NEEDED.     Pulmonology:  Beta Agonists Failed - 02/14/2021  1:29 AM      Failed - One inhaler should last at least one month. If the patient is requesting refills earlier, contact the patient to check for uncontrolled symptoms.      Passed - Valid encounter within last 12 months    Recent Outpatient Visits          3 months ago Establishing care with new doctor, encounter for   [facility] Duanne Limerick, MD      Future Appointments            In 3 days Duanne Limerick, MD [facility]

## 2021-02-17 ENCOUNTER — Ambulatory Visit: Payer: Medicare Other | Admitting: Family Medicine

## 2021-02-18 ENCOUNTER — Other Ambulatory Visit: Payer: Self-pay | Admitting: Family Medicine

## 2021-02-18 DIAGNOSIS — J432 Centrilobular emphysema: Secondary | ICD-10-CM

## 2021-02-18 NOTE — Telephone Encounter (Signed)
Requested medications are due for refill today.  unsure  Requested medications are on the active medications list.  yes  Last refill. 01/25/2021  Future visit scheduled.   no  Notes to clinic.  Pcp listed as Dr.Jewel Llamas.     Requested Prescriptions  Pending Prescriptions Disp Refills   ADVAIR HFA 115-21 MCG/ACT inhaler [Pharmacy Med Name: ADVAIR HFA 115-21 MCG INHALER] 12 each     Sig: INHALE 2 PUFFS INTO THE LUNGS TWICE A DAY     Pulmonology:  Combination Products Passed - 02/18/2021  1:33 AM      Passed - Valid encounter within last 12 months    Recent Outpatient Visits           3 months ago Establishing care with new doctor, encounter for   [facility] Duanne Limerick, MD

## 2021-02-20 ENCOUNTER — Other Ambulatory Visit: Payer: Self-pay | Admitting: Family Medicine

## 2021-02-20 DIAGNOSIS — I10 Essential (primary) hypertension: Secondary | ICD-10-CM

## 2021-02-20 DIAGNOSIS — E7801 Familial hypercholesterolemia: Secondary | ICD-10-CM

## 2021-02-20 NOTE — Telephone Encounter (Signed)
Requested medication (s) are due for refill today - yes  Requested medication (s) are on the active medication list -yes  Future visit scheduled -no  Last refill: 01/19/21 #30- all 3  Notes to clinic: Attempted to call patient to schedule appointment- last RF looks like courtesy from office. Left message to call for appointment- another doctor is listed as PCP- not sure this is current.  Requested Prescriptions  Pending Prescriptions Disp Refills   hydrochlorothiazide (HYDRODIURIL) 25 MG tablet [Pharmacy Med Name: HYDROCHLOROTHIAZIDE 25 MG TAB] 30 tablet 0    Sig: TAKE 1 TABLET (25 MG TOTAL) BY MOUTH DAILY.     Cardiovascular: Diuretics - Thiazide Failed - 02/20/2021  9:31 AM      Failed - Ca in normal range and within 360 days    Calcium  Date Value Ref Range Status  05/31/2020 8.4 (L) 8.9 - 10.3 mg/dL Final          Failed - K in normal range and within 360 days    Potassium  Date Value Ref Range Status  05/31/2020 3.2 (L) 3.5 - 5.1 mmol/L Final          Failed - Last BP in normal range    BP Readings from Last 1 Encounters:  11/05/20 (!) 142/94          Passed - Cr in normal range and within 360 days    Creatinine, Ser  Date Value Ref Range Status  05/31/2020 1.03 0.61 - 1.24 mg/dL Final          Passed - Na in normal range and within 360 days    Sodium  Date Value Ref Range Status  05/31/2020 138 135 - 145 mmol/L Final          Passed - Valid encounter within last 6 months    Recent Outpatient Visits           3 months ago Establishing care with new doctor, encounter for   [facility] Duanne Limerick, MD               losartan (COZAAR) 50 MG tablet [Pharmacy Med Name: LOSARTAN POTASSIUM 50 MG TAB] 30 tablet 0    Sig: TAKE 1 TABLET BY MOUTH EVERY DAY     Cardiovascular:  Angiotensin Receptor Blockers Failed - 02/20/2021  9:31 AM      Failed - Cr in normal range and within 180 days    Creatinine, Ser  Date Value Ref Range Status   05/31/2020 1.03 0.61 - 1.24 mg/dL Final          Failed - K in normal range and within 180 days    Potassium  Date Value Ref Range Status  05/31/2020 3.2 (L) 3.5 - 5.1 mmol/L Final          Failed - Last BP in normal range    BP Readings from Last 1 Encounters:  11/05/20 (!) 142/94          Passed - Patient is not pregnant      Passed - Valid encounter within last 6 months    Recent Outpatient Visits           3 months ago Establishing care with new doctor, encounter for   [facility] Duanne Limerick, MD               atorvastatin (LIPITOR) 40 MG tablet [Pharmacy Med Name: ATORVASTATIN 40 MG TABLET] 30 tablet 0  Sig: TAKE 1 TABLET BY MOUTH EVERY DAY AS NEEDED     Cardiovascular:  Antilipid - Statins Failed - 02/20/2021  9:31 AM      Failed - Total Cholesterol in normal range and within 360 days    No results found for: CHOL, POCCHOL, CHOLTOT        Failed - LDL in normal range and within 360 days    No results found for: LDLCALC, LDLC, HIRISKLDL, POCLDL, LDLDIRECT, REALLDLC, TOTLDLC        Failed - HDL in normal range and within 360 days    No results found for: HDL, POCHDL        Failed - Triglycerides in normal range and within 360 days    No results found for: TRIG, POCTRIG        Passed - Patient is not pregnant      Passed - Valid encounter within last 12 months    Recent Outpatient Visits           3 months ago Establishing care with new doctor, encounter [facility]   [facility] Duanne Limerick[facility], Deanna C, MD                 Requested Prescriptions  Pending Prescriptions Disp Refills   hydrochlorothiazide (HYDRODIURIL) 25 MG tablet [Pharmacy Med Name: HYDROCHLOROTHIAZIDE 25 MG TAB] 30 tablet 0    Sig: TAKE 1 TABLET (25 MG TOTAL) BY MOUTH DAILY.     Cardiovascular: Diuretics - Thiazide Failed - 02/20/2021  9:31 AM      Failed - Ca in normal range and within 360 days    Calcium  Date Value Ref Range Status  05/31/2020 8.4 (L)  8.9 - 10.3 mg/dL Final          Failed - K in normal range and within 360 days    Potassium  Date Value Ref Range Status  05/31/2020 3.2 (L) 3.5 - 5.1 mmol/L Final          Failed - Last BP in normal range    BP Readings from Last 1 Encounters:  11/05/20 (!) 142/94          Passed - Cr in normal range and within 360 days    Creatinine, Ser  Date Value Ref Range Status  05/31/2020 1.03 0.61 - 1.24 mg/dL Final          Passed - Na in normal range and within 360 days    Sodium  Date Value Ref Range Status  05/31/2020 138 135 - 145 mmol/L Final          Passed - Valid encounter within last 6 months    Recent Outpatient Visits           3 months ago Establishing care with new doctor, encounter [facility]   [facility] Duanne LimerickJones, Deanna C, MD               losartan (COZAAR) 50 MG tablet [Pharmacy Med Name: LOSARTAN POTASSIUM 50 MG TAB] 30 tablet 0    Sig: TAKE 1 TABLET BY MOUTH EVERY DAY     Cardiovascular:  Angiotensin Receptor Blockers Failed - 02/20/2021  9:31 AM      Failed - Cr in normal range and within 180 days    Creatinine, Ser  Date Value Ref Range Status  05/31/2020 1.03 0.61 - 1.24 mg/dL Final          Failed - K in normal range and within 180 days  Potassium  Date Value Ref Range Status  05/31/2020 3.2 (L) 3.5 - 5.1 mmol/L Final          Failed - Last BP in normal range    BP Readings from Last 1 Encounters:  11/05/20 (!) 142/94          Passed - Patient is not pregnant      Passed - Valid encounter within last 6 months    Recent Outpatient Visits           3 months ago Establishing care with new doctor, encounter for   [facility] Duanne Limerick, MD               atorvastatin (LIPITOR) 40 MG tablet [Pharmacy Med Name: ATORVASTATIN 40 MG TABLET] 30 tablet 0    Sig: TAKE 1 TABLET BY MOUTH EVERY DAY AS NEEDED     Cardiovascular:  Antilipid - Statins Failed - 02/20/2021  9:31 AM      Failed - Total Cholesterol in  normal range and within 360 days    No results found for: CHOL, POCCHOL, CHOLTOT        Failed - LDL in normal range and within 360 days    No results found for: LDLCALC, LDLC, HIRISKLDL, POCLDL, LDLDIRECT, REALLDLC, TOTLDLC        Failed - HDL in normal range and within 360 days    No results found for: HDL, POCHDL        Failed - Triglycerides in normal range and within 360 days    No results found for: TRIG, POCTRIG        Passed - Patient is not pregnant      Passed - Valid encounter within last 12 months    Recent Outpatient Visits           3 months ago Establishing care with new doctor, encounter for   [facility] Duanne Limerick, MD

## 2021-03-16 ENCOUNTER — Emergency Department: Payer: Medicare Other

## 2021-03-16 ENCOUNTER — Other Ambulatory Visit: Payer: Self-pay

## 2021-03-16 ENCOUNTER — Inpatient Hospital Stay
Admission: EM | Admit: 2021-03-16 | Discharge: 2021-03-19 | DRG: 872 | Disposition: A | Discharge status: Home / Self Care | Payer: Medicare Other | Attending: Internal Medicine | Admitting: Internal Medicine

## 2021-03-16 ENCOUNTER — Encounter: Payer: Self-pay | Admitting: Emergency Medicine

## 2021-03-16 DIAGNOSIS — L0291 Cutaneous abscess, unspecified: Secondary | ICD-10-CM | POA: Diagnosis not present

## 2021-03-16 DIAGNOSIS — A419 Sepsis, unspecified organism: Principal | ICD-10-CM

## 2021-03-16 DIAGNOSIS — L723 Sebaceous cyst: Secondary | ICD-10-CM | POA: Diagnosis present

## 2021-03-16 DIAGNOSIS — I10 Essential (primary) hypertension: Secondary | ICD-10-CM | POA: Diagnosis not present

## 2021-03-16 DIAGNOSIS — Z833 Family history of diabetes mellitus: Secondary | ICD-10-CM

## 2021-03-16 DIAGNOSIS — Z8249 Family history of ischemic heart disease and other diseases of the circulatory system: Secondary | ICD-10-CM

## 2021-03-16 DIAGNOSIS — K5732 Diverticulitis of large intestine without perforation or abscess without bleeding: Secondary | ICD-10-CM | POA: Diagnosis present

## 2021-03-16 DIAGNOSIS — Z888 Allergy status to other drugs, medicaments and biological substances status: Secondary | ICD-10-CM

## 2021-03-16 DIAGNOSIS — L02212 Cutaneous abscess of back [any part, except buttock]: Secondary | ICD-10-CM | POA: Diagnosis present

## 2021-03-16 DIAGNOSIS — E876 Hypokalemia: Secondary | ICD-10-CM | POA: Diagnosis present

## 2021-03-16 DIAGNOSIS — N179 Acute kidney failure, unspecified: Secondary | ICD-10-CM | POA: Diagnosis present

## 2021-03-16 DIAGNOSIS — Z7951 Long term (current) use of inhaled steroids: Secondary | ICD-10-CM

## 2021-03-16 DIAGNOSIS — E782 Mixed hyperlipidemia: Secondary | ICD-10-CM

## 2021-03-16 DIAGNOSIS — J111 Influenza due to unidentified influenza virus with other respiratory manifestations: Secondary | ICD-10-CM

## 2021-03-16 DIAGNOSIS — J101 Influenza due to other identified influenza virus with other respiratory manifestations: Secondary | ICD-10-CM

## 2021-03-16 DIAGNOSIS — K219 Gastro-esophageal reflux disease without esophagitis: Secondary | ICD-10-CM | POA: Diagnosis present

## 2021-03-16 DIAGNOSIS — F17210 Nicotine dependence, cigarettes, uncomplicated: Secondary | ICD-10-CM | POA: Diagnosis present

## 2021-03-16 DIAGNOSIS — Z20822 Contact with and (suspected) exposure to covid-19: Secondary | ICD-10-CM | POA: Diagnosis present

## 2021-03-16 DIAGNOSIS — K5900 Constipation, unspecified: Secondary | ICD-10-CM | POA: Diagnosis present

## 2021-03-16 DIAGNOSIS — Z79899 Other long term (current) drug therapy: Secondary | ICD-10-CM

## 2021-03-16 DIAGNOSIS — E785 Hyperlipidemia, unspecified: Secondary | ICD-10-CM | POA: Diagnosis present

## 2021-03-16 LAB — CBC WITH DIFFERENTIAL/PLATELET
Abs Immature Granulocytes: 0.05 10*3/uL (ref 0.00–0.07)
Basophils Absolute: 0 10*3/uL (ref 0.0–0.1)
Basophils Relative: 1 %
Eosinophils Absolute: 0.1 10*3/uL (ref 0.0–0.5)
Eosinophils Relative: 1 %
HCT: 39 % (ref 39.0–52.0)
Hemoglobin: 13.2 g/dL (ref 13.0–17.0)
Immature Granulocytes: 1 %
Lymphocytes Relative: 6 %
Lymphs Abs: 0.4 10*3/uL — ABNORMAL LOW (ref 0.7–4.0)
MCH: 30.8 pg (ref 26.0–34.0)
MCHC: 33.8 g/dL (ref 30.0–36.0)
MCV: 90.9 fL (ref 80.0–100.0)
Monocytes Absolute: 0.6 10*3/uL (ref 0.1–1.0)
Monocytes Relative: 9 %
Neutro Abs: 5.1 10*3/uL (ref 1.7–7.7)
Neutrophils Relative %: 82 %
Platelets: 243 10*3/uL (ref 150–400)
RBC: 4.29 MIL/uL (ref 4.22–5.81)
RDW: 12.1 % (ref 11.5–15.5)
WBC: 6.3 10*3/uL (ref 4.0–10.5)
nRBC: 0 % (ref 0.0–0.2)

## 2021-03-16 LAB — COMPREHENSIVE METABOLIC PANEL
ALT: 20 U/L (ref 0–44)
AST: 22 U/L (ref 15–41)
Albumin: 3.9 g/dL (ref 3.5–5.0)
Alkaline Phosphatase: 57 U/L (ref 38–126)
Anion gap: 11 (ref 5–15)
BUN: 14 mg/dL (ref 6–20)
CO2: 23 mmol/L (ref 22–32)
Calcium: 8.3 mg/dL — ABNORMAL LOW (ref 8.9–10.3)
Chloride: 100 mmol/L (ref 98–111)
Creatinine, Ser: 1.39 mg/dL — ABNORMAL HIGH (ref 0.61–1.24)
GFR, Estimated: >60 mL/min (ref >60)
Glucose, Bld: 123 mg/dL — ABNORMAL HIGH (ref 70–99)
Potassium: 3.3 mmol/L — ABNORMAL LOW (ref 3.5–5.1)
Sodium: 134 mmol/L — ABNORMAL LOW (ref 135–145)
Total Bilirubin: 0.6 mg/dL (ref 0.3–1.2)
Total Protein: 6.7 g/dL (ref 6.5–8.1)

## 2021-03-16 LAB — LACTIC ACID, PLASMA
Lactic Acid, Venous: 2.2 mmol/L (ref 0.5–1.9)
Lactic Acid, Venous: 1.3 mmol/L (ref 0.5–1.9)

## 2021-03-16 LAB — RESP PANEL BY RT-PCR (FLU A&B, COVID) ARPGX2
Influenza A by PCR: POSITIVE — AB
Influenza B by PCR: NEGATIVE
SARS Coronavirus 2 by RT PCR: NEGATIVE

## 2021-03-16 LAB — TROPONIN I (HIGH SENSITIVITY)
Troponin I (High Sensitivity): 4 ng/L (ref <18)
Troponin I (High Sensitivity): 4 ng/L (ref <18)

## 2021-03-16 MED ORDER — ONDANSETRON HCL 4 MG PO TABS: 4.0000 mg | ORAL_TABLET | Freq: Four times a day (QID) | ORAL | Status: DC | PRN

## 2021-03-16 MED ORDER — MOMETASONE FURO-FORMOTEROL FUM 200-5 MCG/ACT IN AERO
2.0000 | INHALATION_SPRAY | Freq: Two times a day (BID) | RESPIRATORY_TRACT | Status: DC
  Administered 2021-03-17 – 2021-03-19 (×5): 2 via RESPIRATORY_TRACT
  Filled 2021-03-16: qty 8.8

## 2021-03-16 MED ORDER — LACTATED RINGERS IV BOLUS (SEPSIS)
1000.0000 mL | Freq: Once | INTRAVENOUS | Status: AC
  Administered 2021-03-16: 1000 mL via INTRAVENOUS

## 2021-03-16 MED ORDER — VANCOMYCIN HCL IN DEXTROSE 1-5 GM/200ML-% IV SOLN: 1000.0000 mg | Freq: Once | INTRAVENOUS | Status: DC

## 2021-03-16 MED ORDER — ACETAMINOPHEN 325 MG PO TABS
650.0000 mg | ORAL_TABLET | Freq: Four times a day (QID) | ORAL | Status: DC | PRN
  Administered 2021-03-17 – 2021-03-19 (×8): 650 mg via ORAL
  Filled 2021-03-16 (×8): qty 2

## 2021-03-16 MED ORDER — SODIUM CHLORIDE 0.9 % IV SOLN
2.0000 g | Freq: Three times a day (TID) | INTRAVENOUS | Status: DC
  Administered 2021-03-17: 2 g via INTRAVENOUS
  Filled 2021-03-16 (×3): qty 2

## 2021-03-16 MED ORDER — VANCOMYCIN HCL 1750 MG/350ML IV SOLN
1750.0000 mg | INTRAVENOUS | Status: DC
  Filled 2021-03-16: qty 350

## 2021-03-16 MED ORDER — DIPHENHYDRAMINE HCL 50 MG/ML IJ SOLN
25.0000 mg | Freq: Once | INTRAMUSCULAR | Status: AC
  Administered 2021-03-16: 25 mg via INTRAVENOUS
  Filled 2021-03-16: qty 1

## 2021-03-16 MED ORDER — ENOXAPARIN SODIUM 40 MG/0.4ML IJ SOSY
40.0000 mg | PREFILLED_SYRINGE | INTRAMUSCULAR | Status: DC
  Administered 2021-03-17 – 2021-03-19 (×3): 40 mg via SUBCUTANEOUS
  Filled 2021-03-16 (×3): qty 0.4

## 2021-03-16 MED ORDER — SODIUM CHLORIDE 0.9 % IV SOLN
2.0000 g | Freq: Once | INTRAVENOUS | Status: AC
  Administered 2021-03-16: 2 g via INTRAVENOUS
  Filled 2021-03-16: qty 2

## 2021-03-16 MED ORDER — ONDANSETRON HCL 4 MG/2ML IJ SOLN
4.0000 mg | Freq: Four times a day (QID) | INTRAMUSCULAR | Status: DC | PRN
  Administered 2021-03-17 – 2021-03-18 (×2): 4 mg via INTRAVENOUS
  Filled 2021-03-16 (×2): qty 2

## 2021-03-16 MED ORDER — PROCHLORPERAZINE EDISYLATE 10 MG/2ML IJ SOLN
10.0000 mg | Freq: Once | INTRAMUSCULAR | Status: AC
  Administered 2021-03-16: 10 mg via INTRAVENOUS
  Filled 2021-03-16: qty 2

## 2021-03-16 MED ORDER — METRONIDAZOLE 500 MG/100ML IV SOLN
500.0000 mg | Freq: Once | INTRAVENOUS | Status: AC
  Administered 2021-03-16: 500 mg via INTRAVENOUS
  Filled 2021-03-16: qty 100

## 2021-03-16 MED ORDER — SODIUM CHLORIDE 0.9 % IV BOLUS
1000.0000 mL | Freq: Once | INTRAVENOUS | Status: AC
  Administered 2021-03-16: 1000 mL via INTRAVENOUS

## 2021-03-16 MED ORDER — IOHEXOL 300 MG/ML  SOLN
100.0000 mL | Freq: Once | INTRAMUSCULAR | Status: AC | PRN
  Administered 2021-03-16: 100 mL via INTRAVENOUS

## 2021-03-16 MED ORDER — VANCOMYCIN HCL 1500 MG/300ML IV SOLN
1500.0000 mg | Freq: Once | INTRAVENOUS | Status: AC
  Administered 2021-03-16: 1500 mg via INTRAVENOUS
  Filled 2021-03-16: qty 300

## 2021-03-16 MED ORDER — LACTATED RINGERS IV BOLUS (SEPSIS): 1000.0000 mL | Freq: Once | INTRAVENOUS | Status: DC

## 2021-03-16 MED ORDER — LIDOCAINE-EPINEPHRINE 2 %-1:100000 IJ SOLN
20.0000 mL | Freq: Once | INTRAMUSCULAR | Status: AC
  Administered 2021-03-16: 20 mL via INTRADERMAL
  Filled 2021-03-16: qty 1

## 2021-03-16 MED ORDER — METRONIDAZOLE 500 MG/100ML IV SOLN
500.0000 mg | Freq: Two times a day (BID) | INTRAVENOUS | Status: DC
  Administered 2021-03-17: 500 mg via INTRAVENOUS
  Filled 2021-03-16 (×2): qty 100

## 2021-03-16 MED ORDER — ATORVASTATIN CALCIUM 20 MG PO TABS
40.0000 mg | ORAL_TABLET | Freq: Every day | ORAL | Status: DC
  Administered 2021-03-17 – 2021-03-18 (×2): 40 mg via ORAL
  Filled 2021-03-16 (×2): qty 2

## 2021-03-16 MED ORDER — LACTATED RINGERS IV SOLN: INTRAVENOUS | Status: AC

## 2021-03-16 MED ORDER — ALBUTEROL SULFATE HFA 108 (90 BASE) MCG/ACT IN AERS: 2.0000 | INHALATION_SPRAY | Freq: Four times a day (QID) | RESPIRATORY_TRACT | Status: DC | PRN

## 2021-03-16 MED ORDER — ACETAMINOPHEN 650 MG RE SUPP: 650.0000 mg | Freq: Four times a day (QID) | RECTAL | Status: DC | PRN

## 2021-03-16 NOTE — H&P (Addendum)
History and Physical   Jason Cook KDT:[id number] DOB: 01-14-1966 DOA: 03/16/2021  PCP: Weyman Heriberto, MD  Outpatient Specialists: Dr. Loni Muse, pulmonology Patient coming from: Home via EMS  I have personally briefly reviewed patient's old medical records in [place].  Chief Concern: Shortness of breath, body aches and headaches  HPI: [name] is a 56 year old male with hypertension, GERD, hyperlipidemia, who presents to the emergency department for chief concerns of left-sided chest pain.  Per nursing note, patient took aspirin 324 mg at approximately 1830.  He also took 2 blood pressure medications due to missing a couple of days.  Initial vitals in the emergency department showed Tmax of 100.5, respiration rate of 22, heart rate of 88, initial blood pressure 89/58 that has improved to 119/104.  SPO2 96% on room air.  Labs in the ED showed serum sodium 134, potassium 3.3, chloride 100, bicarb 23, nonfasting blood glucose 123, BUN of 14, serum creatinine 1.39, GFR greater than 60, high sensitive troponin was 4, on repeat was also 4.  Lactic acid was initially elevated at 2.2 and improved to 1.3.  CBC was unremarkable.  Patient tested positive for influenza a by PCR.  CT of the abdomen and pelvis mild fat stranding about the descending sigmoid: In the junction of multiple diverticula, suspicious for mild diverticulitis.  Small elongated fluid collection centered in the skin and subcutaneous tissue just to the right of midline measures 11 x 4 x 18 mm.  ED treatment: Compazine 10 mg, cefepime IV, vancomycin IV, metronidazole IV, LR a total of 2 L has been ordered and given per [facility].  Sodium chloride 1 L has been ordered and given per [facility].  At bedside, he is able to tell me his name, age, current year, and current location of [facility].   He reports that his chief concern is headaches and body aches.  He reports that the symptoms started diffusely on day of  admission.  He denies known sick contacts.  He endorses coughing that is nonproductive and this started today as well.  He denies nausea, vomiting, abdominal pain, dysuria, diarrhea.  He reports that he is aware of the abscess in his back.  He could not get an appointment with PCP and presented to urgent care about 2 weeks ago.  He states the provider at urgent care drained it.  He states its been leaking since.  He denies any fever at home.  He reports that the chest pain was initially on the left side of his chest that has now resolved.  He reports that the pain is really diffuse and achiness but is present everywhere.  Social history: He lives by himself. He smokes 1/2 ppd and he started at age 53. At his peak, he smoked 3 ppd. He drinks beer, last drink was 2-3 days. He had 4-5 beers. He smokes THC.   Vaccination history: He is vaccinated for covid 19 and pneumonia. He is not vaccinated for influenza.   ROS: Constitutional: no weight change, no fever ENT/Mouth: no sore throat, no rhinorrhea Eyes: no eye pain, no vision changes Cardiovascular: no chest pain, + dyspnea,  no edema, no palpitations Respiratory: + cough, no sputum, no wheezing Gastrointestinal: no nausea, no vomiting, no diarrhea, no constipation Genitourinary: no urinary incontinence, no dysuria, no hematuria Musculoskeletal: no arthralgias, + myalgias Skin: no skin lesions, no pruritus, Neuro: + weakness, no loss of consciousness, no syncope Psych: no anxiety, no depression, + decrease appetite  Heme/Lymph: no bruising, no bleeding  ED Course: Discussed with EDP, patient requiring hospitalization for chief concerns of meeting sepsis criteria.  Assessment/Plan  Principal Problem:   Sepsis ([place]) Active Problems:   Abscess   Essential hypertension   Hyperlipidemia   Influenza A   * Sepsis ([place]) Assessment & Plan - Patient met sepsis criteria with fever, increased respiration rate, initial hypotension, and increased  lactic acid with source - Continue with IV antibiotic - Status post a total of 3 L of fluid - Continue with LR IVF at 150 mL/h, 10 hours ordered  Essential hypertension Assessment & Plan - Patient takes hydrochlorothiazide 25 mg daily, losartan 50 mg daily; these have not been resumed at this time due to initial hypotension - A.m. team to resume antihypertensive medications when appropriate - Hydralazine injection 5 mg IV every 6 hours as needed for SBP greater than 170, 4 doses ordered  Influenza A Assessment & Plan - Tamiflu 75 mg p.o. twice daily ordered for 5 days  Hyperlipidemia Assessment & Plan - Atorvastatin 40 mg nightly resumed  Abscess Assessment & Plan - Status post I&D by EDP - Continue antibiotics as ordered - Blood cultures x2 ordered  AKI-status post IV fluid - No prior CKD - Holding home hydrochlorothiazide and losartan - BMP in the a.m.  Chart reviewed.   DVT prophylaxis: Enoxaparin Code Status: Full code Diet: Heart healthy Family Communication: No Disposition Plan: Pending clinical course Consults called: None at this time Admission status: Telemetry medical  Past Medical History:  Diagnosis Date   COPD (chronic obstructive pulmonary disease) ([place])    Depression    GERD (gastroesophageal reflux disease)    Hypertension    Past Surgical History:  Procedure Laterality Date   APPENDECTOMY     collapse lung N/A 2015   HERNIA REPAIR     LAPAROSCOPIC APPENDECTOMY  04/21/2018   Procedure: APPENDECTOMY LAPAROSCOPIC;  Surgeon: Olean Ree, MD;  Location: [facility] ORS;  Service: General;;   UMBILICAL HERNIA REPAIR N/A 04/21/2018   Procedure: HERNIA REPAIR UMBILICAL ADULT;  Surgeon: Olean Ree, MD;  Location: [facility] ORS;  Service: General;  Laterality: N/A;   Social History:  reports that he has been smoking cigarettes. He has a 21.00 pack-year smoking history. He has never used smokeless tobacco. He reports current alcohol use of about 8.0 standard  drinks per week. He reports current drug use. Drug: Marijuana.  Allergies  Allergen Reactions   Ibuprofen    Family History  Problem Relation Age of Onset   Hypertension Mother    Diabetes Mother    Cancer Mother    Heart disease Father    Hypertension Father    Cancer Father    Cancer Maternal Grandmother    Cancer Maternal Grandfather    Cancer Paternal Grandmother    Cancer Paternal Grandfather    Family history: Family history reviewed and not pertinent.  Prior to Admission medications   Medication Sig Start Date End Date Taking? Authorizing Provider  ADVAIR HFA 115-21 MCG/ACT inhaler INHALE 2 PUFFS INTO THE LUNGS TWICE A DAY 01/25/21   Juline Patch, MD  albuterol (VENTOLIN HFA) 108 (90 Base) MCG/ACT inhaler INHALE 2 PUFFS INTO THE LUNGS EVERY 6 HOURS AS NEEDED. 02/14/21   Juline Patch, MD  atorvastatin (LIPITOR) 40 MG tablet TAKE 1 TABLET BY MOUTH DAILY AS NEEDED 01/19/21   Juline Patch, MD  buPROPion (WELLBUTRIN SR) 150 MG 12 hr tablet Take 150 mg by mouth in the morning and  at bedtime. Patient not taking: Reported on 11/05/2020    [provider]  hydrochlorothiazide (HYDRODIURIL) 25 MG tablet TAKE 1 TABLET (25 MG TOTAL) BY MOUTH DAILY. 01/19/21   Juline Patch, MD  losartan (COZAAR) 50 MG tablet TAKE 1 TABLET BY MOUTH EVERY DAY 01/19/21   Juline Patch, MD  oxyCODONE (OXY IR/ROXICODONE) 5 MG immediate release tablet Take 1-2 tablets (5-10 mg total) by mouth every 4 (four) hours as needed for moderate pain or severe pain. 04/25/18   Tylene Fantasia, PA-C  pantoprazole (PROTONIX) 40 MG tablet Take 1 tablet (40 mg total) by mouth daily. 11/05/20   Juline Patch, MD  umeclidinium bromide (INCRUSE ELLIPTA) 62.5 MCG/ACT AEPB Inhale 1 puff into the lungs daily. 11/29/20   Juline Patch, MD   Physical Exam: Vitals:   03/16/21 2300 03/16/21 2350 03/17/21 0038 03/17/21 0039  BP: 103/64 106/86 135/78   Pulse: 78 74 74   Resp: '17 18 17   ' Temp:   99 F (37.2  C)   TempSrc:      SpO2: 94% 95% 97%   Weight:    89.5 kg  Height:    '5\' 10"'  (1.778 m)   Constitutional: appears older than chronological age, NAD, calm, comfortable Eyes: PERRL, lids and conjunctivae normal ENMT: Mucous membranes are moist. Posterior pharynx clear of any exudate or lesions. Age-appropriate dentition. Hearing appropriate Neck: normal, supple, no masses, no thyromegaly Respiratory: clear to auscultation bilaterally, no wheezing, no crackles. Normal respiratory effort. No accessory muscle use.  Cardiovascular: Regular rate and rhythm, no murmurs / rubs / gallops. No extremity edema. 2+ pedal pulses. No carotid bruits.  Abdomen: no tenderness, no masses palpated, no hepatosplenomegaly. Bowel sounds positive.  Musculoskeletal: no clubbing / cyanosis. No joint deformity upper and lower extremities. Good ROM, no contractures, no atrophy. Normal muscle tone.  Skin: no rashes, lesions, ulcers. No induration Neurologic: Sensation intact. Strength 5/5 in all 4.  Psychiatric: Normal judgment and insight. Alert and oriented x 3. Normal mood.   EKG: not indicated at this time  Chest x-ray on Admission: I personally reviewed and I agree with radiologist reading as below.  CT HEAD WO CONTRAST (5MM)  Result Date: 03/16/2021 CLINICAL DATA:  Headache. EXAM: CT HEAD WITHOUT CONTRAST TECHNIQUE: Contiguous axial images were obtained from the base of the skull through the vertex without intravenous contrast. RADIATION DOSE REDUCTION: This exam was performed according to the departmental dose-optimization program which includes automated exposure control, adjustment of the mA and/or kV according to patient size and/or use of iterative reconstruction technique. COMPARISON:  None. FINDINGS: Brain: No evidence of acute infarction, hemorrhage, hydrocephalus, extra-axial collection or mass lesion/mass effect. Vascular: No hyperdense vessel or unexpected calcification. Skull: Normal. Negative for  fracture or focal lesion. Sinuses/Orbits: No acute finding. Other: None. IMPRESSION: No acute intracranial process. Electronically Signed   By: Virgina Norfolk M.D.   On: 03/16/2021 21:22   CT ABDOMEN PELVIS W CONTRAST  Result Date: 03/16/2021 CLINICAL DATA:  Acute abdominal pain. Low back pain. Abscess, recently drained at urgent care. Evaluate deep infection. Sepsis. EXAM: CT ABDOMEN AND PELVIS WITH CONTRAST TECHNIQUE: Multidetector CT imaging of the abdomen and pelvis was performed using the standard protocol following bolus administration of intravenous contrast. RADIATION DOSE REDUCTION: This exam was performed according to the departmental dose-optimization program which includes automated exposure control, adjustment of the mA and/or kV according to patient size and/or use of iterative reconstruction technique. CONTRAST:  11m OMNIPAQUE IOHEXOL 300  MG/ML  SOLN COMPARISON:  CT 04/21/2018 FINDINGS: Lower chest: Emphysema. No pleural effusion or focal consolidation. Heart is normal in size. Hepatobiliary: Punctate hypodensity in the dome of the liver is too small to characterize. No suspicious liver lesion. Gallbladder physiologically distended, no calcified stone. No biliary dilatation. Pancreas: No ductal dilatation or inflammation. Spleen: Normal in size without focal abnormality. Anterior splenule adjacent to the pancreatic tail, unchanged. Adrenals/Urinary Tract: Left greater than right adrenal thickening without dominant adrenal nodule, unchanged. No hydronephrosis or perinephric edema. Homogeneous renal enhancement with symmetric excretion on delayed phase imaging. Small subcentimeter hypodensities in the lower right kidney and anterior mid left are too small to characterize but likely small cysts. No renal calculi. Urinary bladder is physiologically distended without wall thickening. Stomach/Bowel: Small hiatal hernia. The remainder of the stomach is unremarkable. There is no small bowel  obstruction or inflammation. Prior appendectomy. There is diffuse colonic diverticulosis. There is mild fat stranding about the descending proximal sigmoid colon junction in the region of multiple diverticula, suspicious for mild acute diverticulitis, for example series 2, image 69. No focally inflamed diverticulum is seen. Vascular/Lymphatic: Normal caliber abdominal aorta with mild atherosclerosis. Patent portal and mesenteric veins. No acute vascular findings. No enlarged lymph nodes in the abdomen or pelvis. Reproductive: Prostate is unremarkable. Other: There fat in both inguinal canals. Small fat containing umbilical hernia. No free air, free fluid, or intra-abdominal fluid collection. Small elongated fluid collections centered in the skin and subcutaneous tissues of the back to just to the right of midline at the level of L2 (patient has transitional lumbosacral anatomy). This measures approximately 11 x 4 x 18 mm with some peripheral enhancement, series 2, image 38. There is no internal air. No soft tissue gas. Extension into the deep soft tissues. There is usual dependent midline edema. Musculoskeletal: There are no acute or suspicious osseous abnormalities. Remote left posterior eleventh and twelfth rib fractures. IMPRESSION: 1. Mild fat stranding about the descending proximal sigmoid colon junction in the region of multiple diverticula, suspicious for mild acute diverticulitis. No focally inflamed diverticulum is seen. 2. Small elongated fluid collections centered in the skin and subcutaneous tissues of the back to just to the right of midline measures approximately 11 x 4 x 18 mm with some peripheral enhancement. No extension into the deep soft tissues. 3. Small hiatal hernia. Aortic Atherosclerosis (ICD10-I70.0) and Emphysema (ICD10-J43.9). Electronically Signed   By: Keith Rake M.D.   On: 03/16/2021 21:30   DG Chest Port 1 View  Result Date: 03/16/2021 CLINICAL DATA:  Possible sepsis,  left-sided chest pain EXAM: PORTABLE CHEST 1 VIEW COMPARISON:  05/31/2020 FINDINGS: Cardiac shadow is within normal limits. Lungs are well aerated bilaterally. No focal infiltrate or effusion is seen. No bony abnormality is noted. IMPRESSION: No active disease. Electronically Signed   By: Inez Catalina M.D.   On: 03/16/2021 20:39    Labs on Admission: I have personally reviewed following labs  CBC: Recent Labs  Lab 03/16/21 1956  WBC 6.3  NEUTROABS 5.1  HGB 13.2  HCT 39.0  MCV 90.9  PLT 366   Basic Metabolic Panel: Recent Labs  Lab 03/16/21 1956  NA 134*  K 3.3*  CL 100  CO2 23  GLUCOSE 123*  BUN 14  CREATININE 1.39*  CALCIUM 8.3*   GFR: Estimated Creatinine Clearance: 68.4 mL/min (A) (by C-G formula based on SCr of 1.39 mg/dL (H)).  Liver Function Tests: Recent Labs  Lab 03/16/21 1956  AST 22  ALT  20  ALKPHOS 57  BILITOT 0.6  PROT 6.7  ALBUMIN 3.9   Urine analysis:    Component Value Date/Time   COLORURINE YELLOW (A) 04/21/2018 0947   APPEARANCEUR CLEAR (A) 04/21/2018 0947   LABSPEC >1.046 (H) 04/21/2018 [place] 6.0 04/21/2018 [place] 04/21/2018 0947   HGBUR NEGATIVE 04/21/2018 0947   BILIRUBINUR NEGATIVE 04/21/2018 0947   KETONESUR 20 (A) 04/21/2018 0947   PROTEINUR NEGATIVE 04/21/2018 0947   NITRITE NEGATIVE 04/21/2018 0947   LEUKOCYTESUR NEGATIVE 04/21/2018 0947   Dr. Tobie Poet [facility]  If 7PM-7AM, please contact overnight-coverage provider If 7AM-7PM, please contact day coverage provider [email or web address]  03/17/2021, 2:11 AM

## 2021-03-16 NOTE — Assessment & Plan Note (Addendum)
-   Patient takes hydrochlorothiazide 25 mg daily, losartan 50 mg daily; these have not been resumed at this time due to initial hypotension - A.m. team to resume antihypertensive medications when appropriate - Hydralazine injection 5 mg IV every 6 hours as needed for SBP greater than 170, 4 doses ordered

## 2021-03-16 NOTE — Sepsis Progress Note (Signed)
Monitoring for the code sepsis protocol. °

## 2021-03-16 NOTE — Assessment & Plan Note (Signed)
-   Atorvastatin 40 mg nightly resumed 

## 2021-03-16 NOTE — Hospital Course (Signed)
[name] is a 56 year old male with hypertension, GERD, hyperlipidemia, who presents to the emergency department for chief concerns of left-sided chest pain.  Per nursing note, patient took aspirin 324 mg at approximately 1830.  He also took 2 blood pressure medications due to missing a couple of days.  Initial vitals in the emergency department showed Tmax of 100.5, respiration rate of 22, heart rate of 88, initial blood pressure 89/58 that has improved to 119/104.  SPO2 96% on room air.  Labs in the ED showed serum sodium 134, potassium 3.3, chloride 100, bicarb 23, nonfasting blood glucose 123, BUN of 14, serum creatinine 1.39, GFR greater than 60, high sensitive troponin was 4, on repeat was also 4.  Lactic acid was initially elevated at 2.2 and improved to 1.3.  CBC was unremarkable.  Patient tested positive for influenza a by PCR.  CT of the abdomen and pelvis mild fat stranding about the descending sigmoid: In the junction of multiple diverticula, suspicious for mild diverticulitis.  Small elongated fluid collection centered in the skin and subcutaneous tissue just to the right of midline measures 11 x 4 x 18 mm.  ED treatment: Compazine 10 mg, cefepime IV, vancomycin IV, metronidazole IV, LR a total of 2 L has been ordered and given per [facility].  Sodium chloride 1 L has been ordered and given per [facility].

## 2021-03-16 NOTE — ED Triage Notes (Signed)
Pt to ED via [facility] from home for L sided CP that started this AM. CP radiating to back. Pt took 324 of ASA PTA @1830  Pt also took 2 BP medications because pt had not taken it in couple days and was complaining of headache.BP medication is HCTZ. Primary just changed his dose to 25 to 50 mg- pt took total of 50mg  today.   Pt has abscess on back, recently had drainage of abscess in urgent care about 2 weeks ago. . Pt states it has been draining ever since- pt describing drainage as yellow and red in color.   Pt has a hx of COPD.  EMS PTA Tx:  400 mL NS 1000 MG tylenol for pt temp of 101.8

## 2021-03-16 NOTE — Progress Notes (Signed)
PHARMACY -  BRIEF ANTIBIOTIC NOTE   Pharmacy has received consult(s) for vancomycin and cefepime from an ED provider.  The patient's profile has been reviewed for ht/wt/allergies/indication/available labs.    One time order(s) placed for   1) 2 grams IV cefepime  2) 1500 mg IV vancomycin  Further antibiotics/pharmacy consults should be ordered by admitting physician if indicated.                       Thank you, KAMAURIE SCHNELLER 03/16/2021  8:10 PM

## 2021-03-16 NOTE — ED Provider Notes (Signed)
[facility] Provider Note    Event Date/Time   First MD Initiated Contact with Patient 03/16/21 1955     (approximate)   History   Chest Pain   HPI  Jason Cook is a 56 y.o. male here with generalized weakness, body aches, headache, fatigue.  The patient states that over the last several days, he has felt generally fatigued.  He has been dealing with a draining, painful wound on his lower back.  He had this lanced at urgent care and was on antibiotics, states it has continued to drain since then.  States that throughout the day, he has had moderate, generalized headache, body aches, and general fatigue.  He states he feels like his entire body hurts.  He is felt weak.  He thought his headache was due to blood pressure so he took 2 of his blood pressure medications, which she had not been taking previously.  He presents for further evaluation.  Denies any abdominal pain.  Denies any chest pain.  No shortness of breath.  No known specific sick contacts.     Physical Exam   Triage Vital Signs: ED Triage Vitals  Enc Vitals Group     BP      Pulse      Resp      Temp      Temp src      SpO2      Weight      Height      Head Circumference      Peak Flow      Pain Score      Pain Loc      Pain Edu?      Excl. in GC?     Most recent vital signs: Vitals:   03/16/21 2350 03/17/21 0038  BP: 106/86 135/78  Pulse: 74 74  Resp: 18 17  Temp:  99 F (37.2 C)  SpO2: 95% 97%     General: Awake, appears unwell. CV:  Slightly diminished peripheral perfusion with delayed cap refill.  Heart regular rate and rhythm.  No murmurs. Resp:  Normal effort.  Slight tachypnea.  Lungs clear. Abd:  No distention.  No tenderness. Other:  Cranial nerves I through XII intact.  Normal sensation light touch bilateral upper lower extremity.  Normal strength.  No focal neurological deficits.  No meningismus or neck stiffness.  On skin exam, patient has draining abscess  and focal cellulitis to the right lower back.  Small amount of bleeding noted. 3 x 3 cm.   ED Results / Procedures / Treatments   Labs (all labs ordered are listed, but only abnormal results are displayed) Labs Reviewed  RESP PANEL BY RT-PCR (FLU A&B, COVID) ARPGX2 - Abnormal; Notable for the following components:      Result Value   Influenza A by PCR POSITIVE (*)    All other components within normal limits  CBC WITH DIFFERENTIAL/PLATELET - Abnormal; Notable for the following components:   Lymphs Abs 0.4 (*)    All other components within normal limits  COMPREHENSIVE METABOLIC PANEL - Abnormal; Notable for the following components:   Sodium 134 (*)    Potassium 3.3 (*)    Glucose, Bld 123 (*)    Creatinine, Ser 1.39 (*)    Calcium 8.3 (*)    All other components within normal limits  LACTIC ACID, PLASMA - Abnormal; Notable for the following components:   Lactic Acid, Venous 2.2 (*)    All other  components within normal limits  CULTURE, BLOOD (ROUTINE X 2)  CULTURE, BLOOD (ROUTINE X 2)  LACTIC ACID, PLASMA  PROCALCITONIN  BASIC METABOLIC PANEL  MAGNESIUM  PHOSPHORUS  CBC WITH DIFFERENTIAL/PLATELET  HIV ANTIBODY (ROUTINE TESTING W REFLEX)  TROPONIN I (HIGH SENSITIVITY)  TROPONIN I (HIGH SENSITIVITY)     EKG    RADIOLOGY Chest x-ray: Clear CT abdomen/pelvis: Possible mild diverticulitis, abscess on the back CT head: No acute intracranial normality  I also independently reviewed and agree wit radiologist interpretations.   PROCEDURES:  Critical Care performed: No  .Critical Care Performed by: Shaune Pollack, MD Authorized by: Shaune Pollack, MD   Critical care provider statement:    Critical care time (minutes):  30   Critical care time was exclusive of:  Separately billable procedures and treating other patients   Critical care was necessary to treat or prevent imminent or life-threatening deterioration of the following conditions:  Cardiac failure,  circulatory failure and sepsis   Critical care was time spent personally by me on the following activities:  Development of treatment plan with patient or surrogate, discussions with consultants, evaluation of patient's response to treatment, examination of patient, ordering and review of laboratory studies, ordering and review of radiographic studies, ordering and performing treatments and interventions, pulse oximetry, re-evaluation of patient's condition and review of old charts   I assumed direction of critical care for this patient from another provider in my specialty: no     Care discussed with: admitting provider   .Marland KitchenIncision and Drainage  Date/Time: 03/17/2021 1:54 AM Performed by: Shaune Pollack, MD Authorized by: Shaune Pollack, MD   Consent:    Consent obtained:  Verbal   Consent given by:  Patient   Risks discussed:  Bleeding, damage to other organs, incomplete drainage, infection and pain   Alternatives discussed:  Alternative treatment and delayed treatment Location:    Type:  Abscess   Size:  2 x 2   Location: Back. Pre-procedure details:    Skin preparation:  Betadine Anesthesia:    Anesthesia method:  Local infiltration   Local anesthetic:  Lidocaine 1% WITH epi Procedure type:    Complexity:  Simple Procedure details:    Incision types:  Single straight   Incision depth:  Dermal   Wound management:  Probed and deloculated and irrigated with saline   Drainage:  Purulent   Drainage amount:  Moderate   Packing materials:  1/2 in iodoform gauze Post-procedure details:    Procedure completion:  Tolerated well, no immediate complications   MEDICATIONS ORDERED IN ED: Medications  atorvastatin (LIPITOR) tablet 40 mg (has no administration in time range)  mometasone-formoterol (DULERA) 200-5 MCG/ACT inhaler 2 puff (has no administration in time range)  acetaminophen (TYLENOL) tablet 650 mg (650 mg Oral Given 03/17/21 0036)    Or  acetaminophen (TYLENOL)  suppository 650 mg ( Rectal See Alternative 03/17/21 0036)  ondansetron (ZOFRAN) tablet 4 mg (has no administration in time range)    Or  ondansetron (ZOFRAN) injection 4 mg (has no administration in time range)  enoxaparin (LOVENOX) injection 40 mg (has no administration in time range)  metroNIDAZOLE (FLAGYL) IVPB 500 mg (has no administration in time range)  lactated ringers infusion ( Intravenous New Bag/Given 03/17/21 0045)  ceFEPIme (MAXIPIME) 2 g in sodium chloride 0.9 % 100 mL IVPB (has no administration in time range)  vancomycin (VANCOREADY) IVPB 1750 mg/350 mL (has no administration in time range)  albuterol (PROVENTIL) (2.5 MG/3ML) 0.083% nebulizer solution 2.5 mg (has  no administration in time range)  influenza vac split quadrivalent PF (FLUARIX) injection 0.5 mL (has no administration in time range)  sodium chloride 0.9 % bolus 1,000 mL (0 mLs Intravenous Stopped 03/16/21 2052)  ceFEPIme (MAXIPIME) 2 g in sodium chloride 0.9 % 100 mL IVPB (0 g Intravenous Stopped 03/16/21 2052)  metroNIDAZOLE (FLAGYL) IVPB 500 mg (0 mg Intravenous Stopped 03/16/21 2223)  lactated ringers bolus 1,000 mL (0 mLs Intravenous Stopped 03/16/21 2223)    And  lactated ringers bolus 1,000 mL (0 mLs Intravenous Stopped 03/16/21 2052)  vancomycin (VANCOREADY) IVPB 1500 mg/300 mL (0 mg Intravenous Stopped 03/17/21 0045)  iohexol (OMNIPAQUE) 300 MG/ML solution 100 mL (100 mLs Intravenous Contrast Given 03/16/21 2107)  prochlorperazine (COMPAZINE) injection 10 mg (10 mg Intravenous Given 03/16/21 2228)  diphenhydrAMINE (BENADRYL) injection 25 mg (25 mg Intravenous Given 03/16/21 2227)  lidocaine-EPINEPHrine (XYLOCAINE W/EPI) 2 %-1:100000 (with pres) injection 20 mL (20 mLs Intradermal Given by Other 03/16/21 2228)     IMPRESSION / MDM / ASSESSMENT AND PLAN / ED COURSE  I reviewed the triage vital signs and the nursing notes.                               The patient is on the cardiac monitor to evaluate for  evidence of arrhythmia and/or significant heart rate changes.   MDM:  56 yo M with no significant PMHx here with fever, weakness, headache, myalgias. Pt tachycardic, hypotensive, ill appearing on arrival. Sepsis protocol initiated with broad-spectrum ABX, 30 cc/kg fluids, and broad work-up. Suspect most of his sepsis physiology is actually 2/2 dehydration and influenza. He incidentally does also have an infected sebaceous cyst/abscess on his back, but has been on ABX for this and the exam is not concerning for deeper infection. Broad labs, imaging obtained as above. CBC without significant leukocytosis or anemia. LA initially elevated at 2.2, fluids given and repeat is wnl. CMP with mild Cr elevation, likely form dehydration. CT head obtained due to c/o headache and is negative. CT a/p obtained and show subQ abscess on back but no deep involvement.   Will admit for fluids, management of sepsis. Re: his back abscess, this was I&D'ed, packing placed. No surgical intervention needed at this time. Pt updated and is in agreement with this plan.   MEDICATIONS GIVEN IN ED: Medications  atorvastatin (LIPITOR) tablet 40 mg (has no administration in time range)  mometasone-formoterol (DULERA) 200-5 MCG/ACT inhaler 2 puff (has no administration in time range)  acetaminophen (TYLENOL) tablet 650 mg (650 mg Oral Given 03/17/21 0036)    Or  acetaminophen (TYLENOL) suppository 650 mg ( Rectal See Alternative 03/17/21 0036)  ondansetron (ZOFRAN) tablet 4 mg (has no administration in time range)    Or  ondansetron (ZOFRAN) injection 4 mg (has no administration in time range)  enoxaparin (LOVENOX) injection 40 mg (has no administration in time range)  metroNIDAZOLE (FLAGYL) IVPB 500 mg (has no administration in time range)  lactated ringers infusion ( Intravenous New Bag/Given 03/17/21 0045)  ceFEPIme (MAXIPIME) 2 g in sodium chloride 0.9 % 100 mL IVPB (has no administration in time range)  vancomycin (VANCOREADY)  IVPB 1750 mg/350 mL (has no administration in time range)  albuterol (PROVENTIL) (2.5 MG/3ML) 0.083% nebulizer solution 2.5 mg (has no administration in time range)  influenza vac split quadrivalent PF (FLUARIX) injection 0.5 mL (has no administration in time range)  sodium chloride 0.9 % bolus 1,000 mL (0  mLs Intravenous Stopped 03/16/21 2052)  ceFEPIme (MAXIPIME) 2 g in sodium chloride 0.9 % 100 mL IVPB (0 g Intravenous Stopped 03/16/21 2052)  metroNIDAZOLE (FLAGYL) IVPB 500 mg (0 mg Intravenous Stopped 03/16/21 2223)  lactated ringers bolus 1,000 mL (0 mLs Intravenous Stopped 03/16/21 2223)    And  lactated ringers bolus 1,000 mL (0 mLs Intravenous Stopped 03/16/21 2052)  vancomycin (VANCOREADY) IVPB 1500 mg/300 mL (0 mg Intravenous Stopped 03/17/21 0045)  iohexol (OMNIPAQUE) 300 MG/ML solution 100 mL (100 mLs Intravenous Contrast Given 03/16/21 2107)  prochlorperazine (COMPAZINE) injection 10 mg (10 mg Intravenous Given 03/16/21 2228)  diphenhydrAMINE (BENADRYL) injection 25 mg (25 mg Intravenous Given 03/16/21 2227)  lidocaine-EPINEPHrine (XYLOCAINE W/EPI) 2 %-1:100000 (with pres) injection 20 mL (20 mLs Intradermal Given by Other 03/16/21 2228)     Consults:  Hospitalist for admission   EMR reviewed  PCP notes from 10/2020     FINAL CLINICAL IMPRESSION(S) / ED DIAGNOSES   Final diagnoses:  Sepsis, due to unspecified organism, unspecified whether acute organ dysfunction present (HCC)  Abscess of back  Influenza     Rx / DC Orders   ED Discharge Orders     None        Note:  This document was prepared using Dragon voice recognition software and may include unintentional dictation errors.   Shaune PollackIsaacs, Makenize Messman, MD 03/17/21 [phone]

## 2021-03-16 NOTE — Progress Notes (Signed)
Pharmacy Antibiotic Note  Jason Cook is a 56 y.o. male admitted on 03/16/2021 with sepsis.  Pharmacy has been consulted for Cefepime and Vancomycin dosing.  Plan: Cefepime 2 gm q8h per indication and renal function  Vancomycin Pt given initial dose of Vancomycin 1500 once Vancomycin 1750 mg IV Q 24 hrs.  Goal AUC 400-550. Expected AUC: 513 SCr used: 1.39  Pharmacy will continue to follow and will adjust abx dosing whenever warranted.  Height: 5\' 10"  (177.8 cm) Weight: 83.9 kg (185 lb) IBW/kg (Calculated) : 73  Temp (24hrs), Avg:99.4 F (37.4 C), Min:98.3 F (36.8 C), Max:100.5 F (38.1 C)  Recent Labs  Lab 03/16/21 1956 03/16/21 2248  WBC 6.3  --   CREATININE 1.39*  --   LATICACIDVEN 2.2* 1.3    Estimated Creatinine Clearance: 62.7 mL/min (A) (by C-G formula based on SCr of 1.39 mg/dL (H)).    Allergies  Allergen Reactions   Ibuprofen     Antimicrobials this admission: 2/22 Cefepime >>  2/22 Flagyl >> x 3 doses 2/23 Vancomycin >>   Microbiology results: 2/22 BCx: Pending  Thank you for allowing pharmacy to be a part of this patients care.  3/22, PharmD, [facility] 03/16/2021 11:49 PM

## 2021-03-16 NOTE — Assessment & Plan Note (Signed)
-   Status post I&D by EDP - Continue antibiotics as ordered - Blood cultures x2 ordered

## 2021-03-16 NOTE — Progress Notes (Signed)
CODE SEPSIS - PHARMACY COMMUNICATION  **Broad Spectrum Antibiotics should be administered within 1 hour of Sepsis diagnosis**  Time Code Sepsis Called/Page Received: 2007  Antibiotics Ordered: cefepime and vancomycin  Time of 1st antibiotic administration: 2011  Additional action taken by pharmacy: none required  If necessary, Name of Provider/Nurse Contacted: N/A    Lowella Bandy ,PharmD Clinical Pharmacist  03/16/2021  8:09 PM

## 2021-03-16 NOTE — ED Notes (Signed)
Pt transported to CT ?

## 2021-03-16 NOTE — Assessment & Plan Note (Signed)
-  Patient met sepsis criteria with fever, increased respiration rate, initial hypotension, and increased lactic acid with source - Continue with IV antibiotic - Status post a total of 3 L of fluid - Continue with LR IVF at 150 mL/h, 10 hours ordered

## 2021-03-17 DIAGNOSIS — K5732 Diverticulitis of large intestine without perforation or abscess without bleeding: Secondary | ICD-10-CM | POA: Diagnosis present

## 2021-03-17 DIAGNOSIS — J101 Influenza due to other identified influenza virus with other respiratory manifestations: Secondary | ICD-10-CM | POA: Diagnosis present

## 2021-03-17 DIAGNOSIS — E876 Hypokalemia: Secondary | ICD-10-CM | POA: Diagnosis present

## 2021-03-17 DIAGNOSIS — K5900 Constipation, unspecified: Secondary | ICD-10-CM | POA: Diagnosis present

## 2021-03-17 DIAGNOSIS — L0291 Cutaneous abscess, unspecified: Secondary | ICD-10-CM | POA: Diagnosis not present

## 2021-03-17 DIAGNOSIS — I10 Essential (primary) hypertension: Secondary | ICD-10-CM | POA: Diagnosis present

## 2021-03-17 DIAGNOSIS — F17210 Nicotine dependence, cigarettes, uncomplicated: Secondary | ICD-10-CM | POA: Diagnosis present

## 2021-03-17 DIAGNOSIS — A419 Sepsis, unspecified organism: Secondary | ICD-10-CM | POA: Diagnosis present

## 2021-03-17 DIAGNOSIS — Z888 Allergy status to other drugs, medicaments and biological substances status: Secondary | ICD-10-CM | POA: Diagnosis not present

## 2021-03-17 DIAGNOSIS — Z79899 Other long term (current) drug therapy: Secondary | ICD-10-CM | POA: Diagnosis not present

## 2021-03-17 DIAGNOSIS — K219 Gastro-esophageal reflux disease without esophagitis: Secondary | ICD-10-CM | POA: Diagnosis present

## 2021-03-17 DIAGNOSIS — N179 Acute kidney failure, unspecified: Secondary | ICD-10-CM | POA: Diagnosis not present

## 2021-03-17 DIAGNOSIS — Z20822 Contact with and (suspected) exposure to covid-19: Secondary | ICD-10-CM | POA: Diagnosis present

## 2021-03-17 DIAGNOSIS — L723 Sebaceous cyst: Secondary | ICD-10-CM | POA: Diagnosis present

## 2021-03-17 DIAGNOSIS — Z833 Family history of diabetes mellitus: Secondary | ICD-10-CM | POA: Diagnosis not present

## 2021-03-17 DIAGNOSIS — E785 Hyperlipidemia, unspecified: Secondary | ICD-10-CM | POA: Diagnosis present

## 2021-03-17 DIAGNOSIS — Z8249 Family history of ischemic heart disease and other diseases of the circulatory system: Secondary | ICD-10-CM | POA: Diagnosis not present

## 2021-03-17 DIAGNOSIS — Z7951 Long term (current) use of inhaled steroids: Secondary | ICD-10-CM | POA: Diagnosis not present

## 2021-03-17 DIAGNOSIS — L02212 Cutaneous abscess of back [any part, except buttock]: Secondary | ICD-10-CM | POA: Diagnosis present

## 2021-03-17 LAB — CBC WITH DIFFERENTIAL/PLATELET
Abs Immature Granulocytes: 0.03 10*3/uL (ref 0.00–0.07)
Basophils Absolute: 0 10*3/uL (ref 0.0–0.1)
Basophils Relative: 1 %
Eosinophils Absolute: 0 10*3/uL (ref 0.0–0.5)
Eosinophils Relative: 1 %
HCT: 33.2 % — ABNORMAL LOW (ref 39.0–52.0)
Hemoglobin: 11.3 g/dL — ABNORMAL LOW (ref 13.0–17.0)
Immature Granulocytes: 1 %
Lymphocytes Relative: 18 %
Lymphs Abs: 0.8 10*3/uL (ref 0.7–4.0)
MCH: 31 pg (ref 26.0–34.0)
MCHC: 34 g/dL (ref 30.0–36.0)
MCV: 91.2 fL (ref 80.0–100.0)
Monocytes Absolute: 0.5 10*3/uL (ref 0.1–1.0)
Monocytes Relative: 12 %
Neutro Abs: 3 10*3/uL (ref 1.7–7.7)
Neutrophils Relative %: 67 %
Platelets: 188 10*3/uL (ref 150–400)
RBC: 3.64 MIL/uL — ABNORMAL LOW (ref 4.22–5.81)
RDW: 12.3 % (ref 11.5–15.5)
WBC: 4.3 10*3/uL (ref 4.0–10.5)
nRBC: 0 % (ref 0.0–0.2)

## 2021-03-17 LAB — BASIC METABOLIC PANEL
Anion gap: 7 (ref 5–15)
BUN: 12 mg/dL (ref 6–20)
CO2: 24 mmol/L (ref 22–32)
Calcium: 7.8 mg/dL — ABNORMAL LOW (ref 8.9–10.3)
Chloride: 106 mmol/L (ref 98–111)
Creatinine, Ser: 1.19 mg/dL (ref 0.61–1.24)
GFR, Estimated: >60 mL/min (ref >60)
Glucose, Bld: 106 mg/dL — ABNORMAL HIGH (ref 70–99)
Potassium: 3 mmol/L — ABNORMAL LOW (ref 3.5–5.1)
Sodium: 137 mmol/L (ref 135–145)

## 2021-03-17 LAB — PHOSPHORUS: Phosphorus: 3.3 mg/dL (ref 2.5–4.6)

## 2021-03-17 LAB — PROCALCITONIN: Procalcitonin: <0.1 ng/mL

## 2021-03-17 LAB — MAGNESIUM: Magnesium: 1.8 mg/dL (ref 1.7–2.4)

## 2021-03-17 LAB — HIV ANTIBODY (ROUTINE TESTING W REFLEX): HIV Screen 4th Generation wRfx: NONREACTIVE

## 2021-03-17 MED ORDER — UMECLIDINIUM BROMIDE 62.5 MCG/ACT IN AEPB
1.0000 | INHALATION_SPRAY | Freq: Every day | RESPIRATORY_TRACT | Status: DC
  Administered 2021-03-17 – 2021-03-19 (×3): 1 via RESPIRATORY_TRACT
  Filled 2021-03-17: qty 7

## 2021-03-17 MED ORDER — BUPROPION HCL ER (SR) 150 MG PO TB12: 150.0000 mg | ORAL_TABLET | Freq: Two times a day (BID) | ORAL | Status: DC

## 2021-03-17 MED ORDER — HYDRALAZINE HCL 20 MG/ML IJ SOLN: 5.0000 mg | Freq: Four times a day (QID) | INTRAMUSCULAR | Status: DC | PRN

## 2021-03-17 MED ORDER — PANTOPRAZOLE SODIUM 40 MG PO TBEC
40.0000 mg | DELAYED_RELEASE_TABLET | Freq: Every day | ORAL | Status: DC
  Administered 2021-03-17 – 2021-03-19 (×3): 40 mg via ORAL
  Filled 2021-03-17 (×3): qty 1

## 2021-03-17 MED ORDER — ALBUTEROL SULFATE (2.5 MG/3ML) 0.083% IN NEBU
2.5000 mg | INHALATION_SOLUTION | Freq: Four times a day (QID) | RESPIRATORY_TRACT | Status: DC | PRN
Start: 2021-03-17 — End: 2021-03-19

## 2021-03-17 MED ORDER — SENNOSIDES-DOCUSATE SODIUM 8.6-50 MG PO TABS
2.0000 | ORAL_TABLET | Freq: Two times a day (BID) | ORAL | Status: DC
  Administered 2021-03-17 – 2021-03-19 (×4): 2 via ORAL
  Filled 2021-03-17 (×5): qty 2

## 2021-03-17 MED ORDER — INFLUENZA VAC SPLIT QUAD 0.5 ML IM SUSY: 0.5000 mL | PREFILLED_SYRINGE | INTRAMUSCULAR | Status: DC

## 2021-03-17 MED ORDER — POTASSIUM CHLORIDE CRYS ER 20 MEQ PO TBCR
40.0000 meq | EXTENDED_RELEASE_TABLET | ORAL | Status: AC
  Administered 2021-03-17 (×2): 40 meq via ORAL
  Filled 2021-03-17 (×2): qty 2

## 2021-03-17 MED ORDER — OSELTAMIVIR PHOSPHATE 75 MG PO CAPS
75.0000 mg | ORAL_CAPSULE | Freq: Two times a day (BID) | ORAL | Status: DC
  Administered 2021-03-17 – 2021-03-19 (×6): 75 mg via ORAL
  Filled 2021-03-17 (×3): qty 1
  Filled 2021-03-17: qty 5
  Filled 2021-03-17 (×4): qty 1

## 2021-03-17 MED ORDER — LACTULOSE 10 GM/15ML PO SOLN
20.0000 g | Freq: Once | ORAL | Status: AC
  Administered 2021-03-17: 20 g via ORAL
  Filled 2021-03-17: qty 30

## 2021-03-17 MED ORDER — DOXYCYCLINE HYCLATE 100 MG PO TABS
100.0000 mg | ORAL_TABLET | Freq: Two times a day (BID) | ORAL | Status: DC
  Administered 2021-03-17 – 2021-03-19 (×5): 100 mg via ORAL
  Filled 2021-03-17 (×3): qty 1
  Filled 2021-03-17: qty 5
  Filled 2021-03-17 (×2): qty 1

## 2021-03-17 NOTE — Plan of Care (Signed)
°  Problem: Education: Goal: Knowledge of General Education information will improve Description: Including pain rating scale, medication(s)/side effects and non-pharmacologic comfort measures Outcome: Progressing Note: Patient profile completed. Patient complained of an ongoing headache. Lower back cyst drained in emergency department.

## 2021-03-17 NOTE — Progress Notes (Signed)
PROGRESS NOTE    Jason Cook  JEH:[id number] DOB: 21-Jun-1965 DOA: 03/16/2021 PCP: Jannet Askew, MD   Chief complaint.  Shortness of breath and headache. Brief Narrative:   Jason Cook is a 56 year old male with hypertension, GERD, hyperlipidemia, who presents to the emergency department for chief concerns of left-sided chest pain. He was positive for influenza A with low-grade fever 100.5.  CT scan showed mild diverticulitis and lower back abscess.  I&D was performed the emergency room, patient was started on antibiotics with vancomycin cefepime and Flagyl.  Assessment & Plan:   Principal Problem:   Sepsis (HCC) Active Problems:   Abscess   Essential hypertension   Hyperlipidemia   Influenza A   AKI (acute kidney injury) (HCC)  Assessment and Plan: No notes have been filed under this hospital service. Service: Hospitalist  Sepsis  Acute colonic diverticulitis. Lower back abscess. Patient condition has been improving.  We will continue antibiotics but changed to Rocephin and oral doxycycline. Status post I&D from emergency room.  Wound culture was not sent.  Blood cultures so far negative. Obtain wound care.  Influenza A. Patient is taking Tamiflu.  Acute kidney injury. Hypokalemia. Hold off hydrochlorothiazide and losartan.  Renal function improved after fluids. Replete potassium.  Essential hypertension. Blood pressure medicine was on hold due to initial hypotension.    DVT prophylaxis: Lovenox Code Status: full Family Communication:  Disposition Plan:    Status is: Inpatient Remains inpatient appropriate because: Severity of disease and IV treatment  I/O last 3 completed shifts: In: 2579.3 [I.V.:1095.5; IV Piggyback:1483.8] Out: 500 [Urine:500] Total I/O In: -  Out: 400 [Urine:400]     Consultants:  None  Procedures: None  Antimicrobials: Rocephin and doxycycline.  Subjective: Patient still complaining significant nasal congestion,  headache and mild cough nonproductive. He feels constipated, no bowel movement for 2 days.  No abdominal pain or nausea vomiting.  Objective: Vitals:   03/17/21 0039 03/17/21 0414 03/17/21 0820 03/17/21 1139  BP:  117/73 121/81 108/78  Pulse:  85 70 73  Resp:  17 20 18   Temp:  99 F (37.2 C) 98.3 F (36.8 C) 98.9 F (37.2 C)  TempSrc:      SpO2:  98% 99% 97%  Weight: 89.5 kg     Height: 5\' 10"  (1.778 m)       Intake/Output Summary (Last 24 hours) at 03/17/2021 1217 Last data filed at 03/17/2021 1215 Gross per 24 hour  Intake 2579.3 ml  Output 900 ml  Net 1679.3 ml   Filed Weights   03/16/21 2004 03/17/21 0039  Weight: 83.9 kg 89.5 kg    Examination:  General exam: Appears calm and comfortable  Respiratory system: Clear to auscultation. Respiratory effort normal. Cardiovascular system: S1 & S2 heard, RRR. No JVD, murmurs, rubs, gallops or clicks. No pedal edema. Gastrointestinal system: Abdomen is nondistended, soft and nontender. No organomegaly or masses felt. Normal bowel sounds heard. Central nervous system: Alert and oriented. No focal neurological deficits. Extremities: Symmetric 5 x 5 power. Skin: Lower back abscess fully drained Psychiatry: Judgement and insight appear normal. Mood & affect appropriate.     Data Reviewed: I have personally reviewed following labs and imaging studies  CBC: Recent Labs  Lab 03/16/21 1956 03/17/21 0352  WBC 6.3 4.3  NEUTROABS 5.1 3.0  HGB 13.2 11.3*  HCT 39.0 33.2*  MCV 90.9 91.2  PLT 243 188   Basic Metabolic Panel: Recent Labs  Lab 03/16/21 1956 03/17/21 0352  NA  134* 137  K 3.3* 3.0*  CL 100 106  CO2 23 24  GLUCOSE 123* 106*  BUN 14 12  CREATININE 1.39* 1.19  CALCIUM 8.3* 7.8*  MG  --  1.8  PHOS  --  3.3   GFR: Estimated Creatinine Clearance: 79.9 mL/min (by C-G formula based on SCr of 1.19 mg/dL). Liver Function Tests: Recent Labs  Lab 03/16/21 1956  AST 22  ALT 20  ALKPHOS 57  BILITOT 0.6   PROT 6.7  ALBUMIN 3.9   No results for input(s): LIPASE, AMYLASE in the last 168 hours. No results for input(s): AMMONIA in the last 168 hours. Coagulation Profile: No results for input(s): INR, PROTIME in the last 168 hours. Cardiac Enzymes: No results for input(s): CKTOTAL, CKMB, CKMBINDEX, TROPONINI in the last 168 hours. BNP (last 3 results) No results for input(s): PROBNP in the last 8760 hours. HbA1C: No results for input(s): HGBA1C in the last 72 hours. CBG: No results for input(s): GLUCAP in the last 168 hours. Lipid Profile: No results for input(s): CHOL, HDL, LDLCALC, TRIG, CHOLHDL, LDLDIRECT in the last 72 hours. Thyroid Function Tests: No results for input(s): TSH, T4TOTAL, FREET4, T3FREE, THYROIDAB in the last 72 hours. Anemia Panel: No results for input(s): VITAMINB12, FOLATE, FERRITIN, TIBC, IRON, RETICCTPCT in the last 72 hours. Sepsis Labs: Recent Labs  Lab 03/16/21 1956 03/16/21 2248  PROCALCITON  --  <0.10  LATICACIDVEN 2.2* 1.3    Recent Results (from the past 240 hour(s))  Blood culture (routine x 2)     Status: None (Preliminary result)   Collection Time: 03/16/21  7:56 PM   Specimen: BLOOD  Result Value Ref Range Status   Specimen Description BLOOD RIGHT ASSIST CONTROL  Final   Special Requests   Final    BOTTLES DRAWN AEROBIC AND ANAEROBIC Blood Culture adequate volume   Culture   Final    NO GROWTH < 12 HOURS Performed at [facility], [address], [place], [place] [address]    Report Status PENDING  Incomplete  Blood culture (routine x 2)     Status: None (Preliminary result)   Collection Time: 03/16/21  7:56 PM   Specimen: BLOOD  Result Value Ref Range Status   Specimen Description BLOOD RIGHT FOREARM  Final   Special Requests   Final    BOTTLES DRAWN AEROBIC AND ANAEROBIC Blood Culture adequate volume   Culture   Final    NO GROWTH < 12 HOURS Performed at [facility], [address], [place], [place] [address]     Report Status PENDING  Incomplete  Resp Panel by RT-PCR (Flu A&B, Covid) Nasopharyngeal Swab     Status: Abnormal   Collection Time: 03/16/21  8:13 PM   Specimen: Nasopharyngeal Swab; Nasopharyngeal(NP) swabs in vial transport medium  Result Value Ref Range Status   SARS Coronavirus 2 by RT PCR NEGATIVE NEGATIVE Final    Comment: (NOTE) SARS-CoV-2 target nucleic acids are NOT DETECTED.  The SARS-CoV-2 RNA is generally detectable in upper respiratory specimens during the acute phase of infection. The lowest concentration of SARS-CoV-2 viral copies this assay can detect is 138 copies/mL. A negative result does not preclude SARS-Cov-2 infection and should not be used as the sole basis for treatment or other patient management decisions. A negative result may occur with  improper specimen collection/handling, submission of specimen other than nasopharyngeal swab, presence of viral mutation(s) within the areas targeted by this assay, and inadequate number of viral copies(<138 copies/mL). A  negative result must be combined with clinical observations, patient history, and epidemiological information. The expected result is Negative.  Fact Sheet for Patients:  BloggerCourse.com  Fact Sheet for Healthcare Providers:  SeriousBroker.it  This test is no t yet approved or cleared by the [place] FDA and  has been authorized for detection and/or diagnosis of SARS-CoV-2 by FDA under an Emergency Use Authorization (EUA). This EUA will remain  in effect (meaning this test can be used) for the duration of the COVID-19 declaration under Section 564(b)(1) of the Act, 21 U.S.C.section 360bbb-3(b)(1), unless the authorization is terminated  or revoked sooner.       Influenza A by PCR POSITIVE (A) NEGATIVE Final   Influenza B by PCR NEGATIVE NEGATIVE Final    Comment: (NOTE) The Xpert Xpress SARS-CoV-2/FLU/RSV plus assay is intended as an  aid in the diagnosis of influenza from Nasopharyngeal swab specimens and should not be used as a sole basis for treatment. Nasal washings and aspirates are unacceptable for Xpert Xpress SARS-CoV-2/FLU/RSV testing.  Fact Sheet for Patients: BloggerCourse.com  Fact Sheet for Healthcare Providers: SeriousBroker.it  This test is not yet approved or cleared by the [place] FDA and has been authorized for detection and/or diagnosis of SARS-CoV-2 by FDA under an Emergency Use Authorization (EUA). This EUA will remain in effect (meaning this test can be used) for the duration of the COVID-19 declaration under Section 564(b)(1) of the Act, 21 U.S.C. section 360bbb-3(b)(1), unless the authorization is terminated or revoked.  Performed at [facility], [address], [place], [place] [address]          Radiology Studies: CT HEAD WO CONTRAST ( )  Result Date: 03/16/2021 CLINICAL DATA:  Headache. EXAM: CT HEAD WITHOUT CONTRAST TECHNIQUE: Contiguous axial images were obtained from the base of the skull through the vertex without intravenous contrast. RADIATION DOSE REDUCTION: This exam was performed according to the departmental dose-optimization program which includes automated exposure control, adjustment of the mA and/or kV according to patient size and/or use of iterative reconstruction technique. COMPARISON:  None. FINDINGS: Brain: No evidence of acute infarction, hemorrhage, hydrocephalus, extra-axial collection or mass lesion/mass effect. Vascular: No hyperdense vessel or unexpected calcification. Skull: Normal. Negative for fracture or focal lesion. Sinuses/Orbits: No acute finding. Other: None. IMPRESSION: No acute intracranial process. Electronically Signed   By: Aram Candela M.D.   On: 03/16/2021 21:22   CT ABDOMEN PELVIS W CONTRAST  Result Date: 03/16/2021 CLINICAL DATA:  Acute abdominal pain. Low back pain.  Abscess, recently drained at urgent care. Evaluate deep infection. Sepsis. EXAM: CT ABDOMEN AND PELVIS WITH CONTRAST TECHNIQUE: Multidetector CT imaging of the abdomen and pelvis was performed using the standard protocol following bolus administration of intravenous contrast. RADIATION DOSE REDUCTION: This exam was performed according to the departmental dose-optimization program which includes automated exposure control, adjustment of the mA and/or kV according to patient size and/or use of iterative reconstruction technique. CONTRAST:  OMNIPAQUE IOHEXOL 300 MG/ML  SOLN COMPARISON:  CT 04/21/2018 FINDINGS: Lower chest: Emphysema. No pleural effusion or focal consolidation. Heart is normal in size. Hepatobiliary: Punctate hypodensity in the dome of the liver is too small to characterize. No suspicious liver lesion. Gallbladder physiologically distended, no calcified stone. No biliary dilatation. Pancreas: No ductal dilatation or inflammation. Spleen: Normal in size without focal abnormality. Anterior splenule adjacent to the pancreatic tail, unchanged. Adrenals/Urinary Tract: Left greater than right adrenal thickening without dominant adrenal nodule, unchanged. No hydronephrosis or perinephric edema. Homogeneous renal enhancement with  symmetric excretion on delayed phase imaging. Small subcentimeter hypodensities in the lower right kidney and anterior mid left are too small to characterize but likely small cysts. No renal calculi. Urinary bladder is physiologically distended without wall thickening. Stomach/Bowel: Small hiatal hernia. The remainder of the stomach is unremarkable. There is no small bowel obstruction or inflammation. Prior appendectomy. There is diffuse colonic diverticulosis. There is mild fat stranding about the descending proximal sigmoid colon junction in the region of multiple diverticula, suspicious for mild acute diverticulitis, for example series 2, image 69. No focally inflamed  diverticulum is seen. Vascular/Lymphatic: Normal caliber abdominal aorta with mild atherosclerosis. Patent portal and mesenteric veins. No acute vascular findings. No enlarged lymph nodes in the abdomen or pelvis. Reproductive: Prostate is unremarkable. Other: There fat in both inguinal canals. Small fat containing umbilical hernia. No free air, free fluid, or intra-abdominal fluid collection. Small elongated fluid collections centered in the skin and subcutaneous tissues of the back to just to the right of midline at the level of L2 (patient has transitional lumbosacral anatomy). This measures approximately 11 x 4 x 18 mm with some peripheral enhancement, series 2, image 38. There is no internal air. No soft tissue gas. Extension into the deep soft tissues. There is usual dependent midline edema. Musculoskeletal: There are no acute or suspicious osseous abnormalities. Remote left posterior eleventh and twelfth rib fractures. IMPRESSION: 1. Mild fat stranding about the descending proximal sigmoid colon junction in the region of multiple diverticula, suspicious for mild acute diverticulitis. No focally inflamed diverticulum is seen. 2. Small elongated fluid collections centered in the skin and subcutaneous tissues of the back to just to the right of midline measures approximately 11 x 4 x 18 mm with some peripheral enhancement. No extension into the deep soft tissues. 3. Small hiatal hernia. Aortic Atherosclerosis (ICD10-I70.0) and Emphysema (ICD10-J43.9). Electronically Signed   By: Narda RutherfordMelanie  Sanford M.D.   On: 03/16/2021 21:30   DG Chest Port 1 View  Result Date: 03/16/2021 CLINICAL DATA:  Possible sepsis, left-sided chest pain EXAM: PORTABLE CHEST 1 VIEW COMPARISON:  05/31/2020 FINDINGS: Cardiac shadow is within normal limits. Lungs are well aerated bilaterally. No focal infiltrate or effusion is seen. No bony abnormality is noted. IMPRESSION: No active disease. Electronically Signed   By: Alcide CleverMark  Lukens M.D.    On: 03/16/2021 20:39        Scheduled Meds:  atorvastatin  40 mg Oral QHS   doxycycline  100 mg Oral Q12H   enoxaparin (LOVENOX) injection  40 mg Subcutaneous Q24H   [START ON 03/18/2021] influenza vac split quadrivalent PF  0.5 mL Intramuscular Tomorrow-1000   mometasone-formoterol  2 puff Inhalation BID   oseltamivir  75 mg Oral BID   pantoprazole  40 mg Oral Daily   potassium chloride  40 mEq Oral [id number]   senna-docusate  2 tablet Oral BID   umeclidinium bromide  1 puff Inhalation Daily   Continuous Infusions:  ceFEPime (MAXIPIME) IV 2 g (03/17/21 0415)     LOS: 0 days    Time spent: 27 minutes    Marrion Coyekui Keimari [place], MD [facility]   To contact the attending provider between 7A-7P or the covering provider during after hours 7P-7A, please log into the web site [email or web address] and access using universal [place] password for that web site. If you do not have the password, please call the hospital operator.  03/17/2021, 12:17 PM

## 2021-03-17 NOTE — Assessment & Plan Note (Signed)
-   Tamiflu 75 mg p.o. twice daily ordered for 5 days

## 2021-03-17 NOTE — Consult Note (Addendum)
WOC Nurse Consult Note: Reason for Consult: Consult requested for middle back wound.  Pt had I&D performed in the ED on 2/22 for an abscess to this location, according to progress notes. Wound type: Full thickness wound; .5X.5X2cm; packing removed to assess wound.  Narrow wound is difficult to visualize; appears beefy red with mod amt bloody drainage, no pus or erythremia surrounding the wound. Pt tolerated with mod amt discomfort.  Dressing procedure/placement/frequency: Discussed plan of care with patient.  He states he does not have anyone to assist with packing the wound after discharge. Since the wound is faily shallow, the packing can remain out upon discharge and I discussed the importance of daily showering without a dressing, then reapplication of gauze and tape and he verbalized understanding. Continue daily packing while in the hospital.  Topical treatment orders provided for bedside nurses to perform as follows:  1. While patient is in the hospital:  pack back wound with Iodoform packing strip using swab to fill Q day, then cover with foam dressing. (Change foam dressing Q 3 days or PRN soiling.)  2. When patient is discharged, remove the packing and cover wound with 4X4 and tape. Patient should shower without a dressing Q day at home, then reapply gauze and tape.  Please re-consult if further assistance is needed.  Thank-you,  Cammie Mcgee MSN, RN, CWOCN, [place], CNS [phone]

## 2021-03-17 NOTE — Assessment & Plan Note (Addendum)
-   Status post IV fluid - BMP in the a.m. - Holding home Hydrochlorothiazide and losartan

## 2021-03-18 DIAGNOSIS — L0291 Cutaneous abscess, unspecified: Secondary | ICD-10-CM | POA: Diagnosis not present

## 2021-03-18 DIAGNOSIS — N179 Acute kidney failure, unspecified: Secondary | ICD-10-CM | POA: Diagnosis not present

## 2021-03-18 DIAGNOSIS — A419 Sepsis, unspecified organism: Secondary | ICD-10-CM | POA: Diagnosis not present

## 2021-03-18 LAB — BASIC METABOLIC PANEL
Anion gap: 8 (ref 5–15)
BUN: 13 mg/dL (ref 6–20)
CO2: 24 mmol/L (ref 22–32)
Calcium: 8.1 mg/dL — ABNORMAL LOW (ref 8.9–10.3)
Chloride: 104 mmol/L (ref 98–111)
Creatinine, Ser: 1.07 mg/dL (ref 0.61–1.24)
GFR, Estimated: >60 mL/min (ref >60)
Glucose, Bld: 110 mg/dL — ABNORMAL HIGH (ref 70–99)
Potassium: 3.2 mmol/L — ABNORMAL LOW (ref 3.5–5.1)
Sodium: 136 mmol/L (ref 135–145)

## 2021-03-18 LAB — MAGNESIUM: Magnesium: 1.8 mg/dL (ref 1.7–2.4)

## 2021-03-18 MED ORDER — POTASSIUM CHLORIDE 10 MEQ/100ML IV SOLN
10.0000 meq | INTRAVENOUS | Status: AC
  Administered 2021-03-18 (×4): 10 meq via INTRAVENOUS
  Filled 2021-03-18 (×3): qty 100

## 2021-03-18 MED ORDER — POTASSIUM CHLORIDE CRYS ER 10 MEQ PO TBCR
10.0000 meq | EXTENDED_RELEASE_TABLET | Freq: Two times a day (BID) | ORAL | Status: DC
Start: 2021-03-18 — End: 2021-03-19
  Administered 2021-03-18 – 2021-03-19 (×2): 10 meq via ORAL
  Filled 2021-03-18: qty 5
  Filled 2021-03-18 (×2): qty 1

## 2021-03-18 MED ORDER — OSELTAMIVIR PHOSPHATE 75 MG PO CAPS: 75.0000 mg | ORAL_CAPSULE | Freq: Two times a day (BID) | ORAL | 0 refills | Status: AC

## 2021-03-18 MED ORDER — DOXYCYCLINE HYCLATE 100 MG PO TABS: 100.0000 mg | ORAL_TABLET | Freq: Two times a day (BID) | ORAL | 0 refills | Status: AC

## 2021-03-18 MED ORDER — POTASSIUM CHLORIDE 10 MEQ/100ML IV SOLN
INTRAVENOUS | Status: AC
  Filled 2021-03-18: qty 100

## 2021-03-18 MED ORDER — POTASSIUM CHLORIDE CRYS ER 10 MEQ PO TBCR: 10.0000 meq | EXTENDED_RELEASE_TABLET | Freq: Two times a day (BID) | ORAL | 0 refills | Status: AC

## 2021-03-18 MED ORDER — AMOXICILLIN-POT CLAVULANATE 875-125 MG PO TABS
1.0000 | ORAL_TABLET | Freq: Two times a day (BID) | ORAL | Status: DC
  Administered 2021-03-18 – 2021-03-19 (×2): 1 via ORAL
  Filled 2021-03-18: qty 1
  Filled 2021-03-18: qty 5
  Filled 2021-03-18: qty 1

## 2021-03-18 MED ORDER — AMOXICILLIN-POT CLAVULANATE 875-125 MG PO TABS: 1.0000 | ORAL_TABLET | Freq: Two times a day (BID) | ORAL | 0 refills | Status: AC

## 2021-03-18 NOTE — Discharge Instructions (Signed)
Wound care: While patient is in the hospital:  pack back wound with Iodoform packing strip using swab to fill Q day, then cover with foam dressing. (Change foam dressing Q 3 days or PRN soiling.)  2. When patient is discharged, remove the packing and cover wound with 4X4 and tape. Patient should shower without a dressing Q day at home, then reapply gauze and tape.

## 2021-03-18 NOTE — TOC Progression Note (Addendum)
Transition of Care [facility]) - Progression Note    Patient Details  Name: Jason Cook MRN: [id number] Date of Birth: 04/04/1965  [facility] CM/SW Contact  Marlowe Sax, RN Phone Number: 03/18/2021, 10:43 AM  Clinical Narrative:    Transition of Care (TOC) Screening Note   Patient Details  Name: Jason Cook Date of Birth: [birth year]   Transition of Care [facility]) CM/SW Contact:    Marlowe Sax, RN Phone Number: 03/18/2021, 10:43 AM Provided the nurse with a cab voucher for the patient to get home, Requested medicatioon from the pharmacy    Transition of Care Department [facility]) has reviewed patient and no TOC needs have been identified at this time. We will continue to monitor patient advancement through interdisciplinary progression rounds. If new patient transition needs arise, please place a TOC consult.           Expected Discharge Plan and Services           Expected Discharge Date: 03/18/21                                     Social Determinants of Health (SDOH) Interventions    Readmission Risk Interventions No flowsheet data found.

## 2021-03-18 NOTE — Discharge Summary (Signed)
Physician Discharge Summary   Patient: Jason Cook MRN: [id number] DOB: 12/01/1965  Admit date:     03/16/2021  Discharge date: 03/18/21  Discharge Physician: Sharen Hones   PCP: Weyman Django, MD   Recommendations at discharge:   Follow-up with PCP in 1 week. Daily shower without dressing, reapplication of a gauze and tape after shower.  Discharge Diagnoses: Principal Problem:   Sepsis ([place]) Active Problems:   Abscess   Essential hypertension   Hyperlipidemia   Influenza A   AKI (acute kidney injury) ([place])  Resolved Problems:   * No resolved hospital problems. [facility] Course: Jason Cook is a 56 year old male with hypertension, GERD, hyperlipidemia, who presents to the emergency department for chief concerns of left-sided chest pain. He was positive for influenza A with low-grade fever 100.5.  CT scan showed mild diverticulitis and lower back abscess.  I&D was performed the emergency room, patient was started on antibiotics with vancomycin cefepime and Flagyl.    Assessment and Plan: No notes have been filed under this hospital service. Service: Hospitalist  Sepsis  Acute colonic diverticulitis. Lower back abscess. Status post I&D from emergency room.  Wound culture was not sent.  Blood cultures so far negative. Patient condition has improved, antibiotic changed to oral doxycycline and Augmentin for additional 4 days.  Patient will need to follow-up with PCP in 1 week.   Influenza A. Patient is taking Tamiflu.  Acute kidney injury. Hypokalemia. Renal function normalized, will give additional 40 mEq KCl before discharge.  Also prescribed 20 mg of KCl daily with concurrent use of hydrochlorothiazide.  Essential hypertension. Resumed hydrochlorothiazide.        Consultants: None Procedures performed: I&D  Disposition: Home Diet recommendation:  Discharge Diet Orders (From admission, onward)     Start     Ordered   03/18/21 0000  Diet - low  sodium heart healthy        03/18/21 0931           Cardiac diet  DISCHARGE MEDICATION: Allergies as of 03/18/2021       Reactions   Ibuprofen         Medication List     STOP taking these medications    Advair HFA 115-21 MCG/ACT inhaler Generic drug: fluticasone-salmeterol       TAKE these medications    albuterol 108 (90 Base) MCG/ACT inhaler Commonly known as: VENTOLIN HFA INHALE 2 PUFFS INTO THE LUNGS EVERY 6 HOURS AS NEEDED.   amoxicillin-clavulanate 875-125 MG tablet Commonly known as: Augmentin Take 1 tablet by mouth 2 (two) times daily for 4 days.   atorvastatin 40 MG tablet Commonly known as: LIPITOR TAKE 1 TABLET BY MOUTH DAILY AS NEEDED   doxycycline 100 MG tablet Commonly known as: VIBRA-TABS Take 1 tablet (100 mg total) by mouth every 12 (twelve) hours for 4 days.   hydrochlorothiazide 25 MG tablet Commonly known as: HYDRODIURIL TAKE 1 TABLET (25 MG TOTAL) BY MOUTH DAILY.   losartan 25 MG tablet Commonly known as: COZAAR Take 25 mg by mouth daily. What changed: Another medication with the same name was removed. Continue taking this medication, and follow the directions you see here.   oseltamivir 75 MG capsule Commonly known as: TAMIFLU Take 1 capsule (75 mg total) by mouth 2 (two) times daily for 3 days.   pantoprazole 40 MG tablet Commonly known as: PROTONIX Take 1 tablet (40 mg total) by mouth daily.   potassium chloride 10  MEQ tablet Commonly known as: KLOR-CON M Take 1 tablet (10 mEq total) by mouth 2 (two) times daily.   Symbicort 160-4.5 MCG/ACT inhaler Generic drug: budesonide-formoterol Inhale 1 puff into the lungs in the morning and at bedtime.   traZODone 100 MG tablet Commonly known as: DESYREL Take 100 mg by mouth at bedtime.               Discharge Care Instructions  (From admission, onward)           Start     Ordered   03/18/21 0000  Discharge wound care:       Comments: daily showering without a  dressing, then reapplication of gauze and tape , follow with pcp   03/18/21 0931            Follow-up Information     Llamas, Jewel C, MD Follow up in 1 week(s).   Specialty: Family Medicine Contact information: [place] [address] [phone]                 Discharge Exam: Danley Danker Weights   03/16/21 2004 03/17/21 0039  Weight: 83.9 kg 89.5 kg   General exam: Appears calm and comfortable  Respiratory system: Clear to auscultation. Respiratory effort normal. Cardiovascular system: S1 & S2 heard, RRR. No JVD, murmurs, rubs, gallops or clicks. No pedal edema. Gastrointestinal system: Abdomen is nondistended, soft and nontender. No organomegaly or masses felt. Normal bowel sounds heard. Central nervous system: Alert and oriented. No focal neurological deficits. Extremities: Symmetric 5 x 5 power. Skin: No rashes, lesions or ulcers Psychiatry: Judgement and insight appear normal. Mood & affect appropriate.    Condition at discharge: good  The results of significant diagnostics from this hospitalization (including imaging, microbiology, ancillary and laboratory) are listed below for reference.   Imaging Studies: CT HEAD WO CONTRAST (5MM)  Result Date: 03/16/2021 CLINICAL DATA:  Headache. EXAM: CT HEAD WITHOUT CONTRAST TECHNIQUE: Contiguous axial images were obtained from the base of the skull through the vertex without intravenous contrast. RADIATION DOSE REDUCTION: This exam was performed according to the departmental dose-optimization program which includes automated exposure control, adjustment of the mA and/or kV according to patient size and/or use of iterative reconstruction technique. COMPARISON:  None. FINDINGS: Brain: No evidence of acute infarction, hemorrhage, hydrocephalus, extra-axial collection or mass lesion/mass effect. Vascular: No hyperdense vessel or unexpected calcification. Skull: Normal. Negative for fracture or focal lesion.  Sinuses/Orbits: No acute finding. Other: None. IMPRESSION: No acute intracranial process. Electronically Signed   By: Virgina Norfolk M.D.   On: 03/16/2021 21:22   CT ABDOMEN PELVIS W CONTRAST  Result Date: 03/16/2021 CLINICAL DATA:  Acute abdominal pain. Low back pain. Abscess, recently drained at urgent care. Evaluate deep infection. Sepsis. EXAM: CT ABDOMEN AND PELVIS WITH CONTRAST TECHNIQUE: Multidetector CT imaging of the abdomen and pelvis was performed using the standard protocol following bolus administration of intravenous contrast. RADIATION DOSE REDUCTION: This exam was performed according to the departmental dose-optimization program which includes automated exposure control, adjustment of the mA and/or kV according to patient size and/or use of iterative reconstruction technique. CONTRAST:  116mL OMNIPAQUE IOHEXOL 300 MG/ML  SOLN COMPARISON:  CT 04/21/2018 FINDINGS: Lower chest: Emphysema. No pleural effusion or focal consolidation. Heart is normal in size. Hepatobiliary: Punctate hypodensity in the dome of the liver is too small to characterize. No suspicious liver lesion. Gallbladder physiologically distended, no calcified stone. No biliary dilatation. Pancreas: No ductal dilatation or inflammation. Spleen: Normal  in size without focal abnormality. Anterior splenule adjacent to the pancreatic tail, unchanged. Adrenals/Urinary Tract: Left greater than right adrenal thickening without dominant adrenal nodule, unchanged. No hydronephrosis or perinephric edema. Homogeneous renal enhancement with symmetric excretion on delayed phase imaging. Small subcentimeter hypodensities in the lower right kidney and anterior mid left are too small to characterize but likely small cysts. No renal calculi. Urinary bladder is physiologically distended without wall thickening. Stomach/Bowel: Small hiatal hernia. The remainder of the stomach is unremarkable. There is no small bowel obstruction or inflammation. Prior  appendectomy. There is diffuse colonic diverticulosis. There is mild fat stranding about the descending proximal sigmoid colon junction in the region of multiple diverticula, suspicious for mild acute diverticulitis, for example series 2, image 69. No focally inflamed diverticulum is seen. Vascular/Lymphatic: Normal caliber abdominal aorta with mild atherosclerosis. Patent portal and mesenteric veins. No acute vascular findings. No enlarged lymph nodes in the abdomen or pelvis. Reproductive: Prostate is unremarkable. Other: There fat in both inguinal canals. Small fat containing umbilical hernia. No free air, free fluid, or intra-abdominal fluid collection. Small elongated fluid collections centered in the skin and subcutaneous tissues of the back to just to the right of midline at the level of L2 (patient has transitional lumbosacral anatomy). This measures approximately 11 x 4 x 18 mm with some peripheral enhancement, series 2, image 38. There is no internal air. No soft tissue gas. Extension into the deep soft tissues. There is usual dependent midline edema. Musculoskeletal: There are no acute or suspicious osseous abnormalities. Remote left posterior eleventh and twelfth rib fractures. IMPRESSION: 1. Mild fat stranding about the descending proximal sigmoid colon junction in the region of multiple diverticula, suspicious for mild acute diverticulitis. No focally inflamed diverticulum is seen. 2. Small elongated fluid collections centered in the skin and subcutaneous tissues of the back to just to the right of midline measures approximately 11 x 4 x 18 mm with some peripheral enhancement. No extension into the deep soft tissues. 3. Small hiatal hernia. Aortic Atherosclerosis (ICD10-I70.0) and Emphysema (ICD10-J43.9). Electronically Signed   By: Keith Rake M.D.   On: 03/16/2021 21:30   DG Chest Port 1 View  Result Date: 03/16/2021 CLINICAL DATA:  Possible sepsis, left-sided chest pain EXAM: PORTABLE CHEST  1 VIEW COMPARISON:  05/31/2020 FINDINGS: Cardiac shadow is within normal limits. Lungs are well aerated bilaterally. No focal infiltrate or effusion is seen. No bony abnormality is noted. IMPRESSION: No active disease. Electronically Signed   By: Inez Catalina M.D.   On: 03/16/2021 20:39    Microbiology: Results for orders placed or performed during the hospital encounter of 03/16/21  Blood culture (routine x 2)     Status: None (Preliminary result)   Collection Time: 03/16/21  7:56 PM   Specimen: BLOOD  Result Value Ref Range Status   Specimen Description BLOOD RIGHT ASSIST CONTROL  Final   Special Requests   Final    BOTTLES DRAWN AEROBIC AND ANAEROBIC Blood Culture adequate volume   Culture   Final    NO GROWTH 2 DAYS Performed at [facility], [address] [place], [place] [address]    Report Status PENDING  Incomplete  Blood culture (routine x 2)     Status: None (Preliminary result)   Collection Time: 03/16/21  7:56 PM   Specimen: BLOOD  Result Value Ref Range Status   Specimen Description BLOOD RIGHT FOREARM  Final   Special Requests   Final    BOTTLES DRAWN AEROBIC  AND ANAEROBIC Blood Culture adequate volume   Culture   Final    NO GROWTH 2 DAYS Performed at [facility], [address] [place], [place] [address]    Report Status PENDING  Incomplete  Resp Panel by RT-PCR (Flu A&B, Covid) Nasopharyngeal Swab     Status: Abnormal   Collection Time: 03/16/21  8:13 PM   Specimen: Nasopharyngeal Swab; Nasopharyngeal(NP) swabs in vial transport medium  Result Value Ref Range Status   SARS Coronavirus 2 by RT PCR NEGATIVE NEGATIVE Final    Comment: (NOTE) SARS-CoV-2 target nucleic acids are NOT DETECTED.  The SARS-CoV-2 RNA is generally detectable in upper respiratory specimens during the acute phase of infection. The lowest concentration of SARS-CoV-2 viral copies this assay can detect is 138 copies/mL. A negative result does not preclude  SARS-Cov-2 infection and should not be used as the sole basis for treatment or other patient management decisions. A negative result may occur with  improper specimen collection/handling, submission of specimen other than nasopharyngeal swab, presence of viral mutation(s) within the areas targeted by this assay, and inadequate number of viral copies(<138 copies/mL). A negative result must be combined with clinical observations, patient history, and epidemiological information. The expected result is Negative.  Fact Sheet for Patients:  EntrepreneurPulse.com.au  Fact Sheet for Healthcare Providers:  IncredibleEmployment.be  This test is no t yet approved or cleared by the [place] FDA and  has been authorized for detection and/or diagnosis of SARS-CoV-2 by FDA under an Emergency Use Authorization (EUA). This EUA will remain  in effect (meaning this test can be used) for the duration of the COVID-19 declaration under Section 564(b)(1) of the Act, 21 U.S.C.section 360bbb-3(b)(1), unless the authorization is terminated  or revoked sooner.       Influenza A by PCR POSITIVE (A) NEGATIVE Final   Influenza B by PCR NEGATIVE NEGATIVE Final    Comment: (NOTE) The Xpert Xpress SARS-CoV-2/FLU/RSV plus assay is intended as an aid in the diagnosis of influenza from Nasopharyngeal swab specimens and should not be used as a sole basis for treatment. Nasal washings and aspirates are unacceptable for Xpert Xpress SARS-CoV-2/FLU/RSV testing.  Fact Sheet for Patients: EntrepreneurPulse.com.au  Fact Sheet for Healthcare Providers: IncredibleEmployment.be  This test is not yet approved or cleared by the [place] FDA and has been authorized for detection and/or diagnosis of SARS-CoV-2 by FDA under an Emergency Use Authorization (EUA). This EUA will remain in effect (meaning this test can be used) for the duration of  the COVID-19 declaration under Section 564(b)(1) of the Act, 21 U.S.C. section 360bbb-3(b)(1), unless the authorization is terminated or revoked.  Performed at [facility], [address] [place], [place] [address]     Labs: CBC: Recent Labs  Lab 03/16/21 1956 03/17/21 0352  WBC 6.3 4.3  NEUTROABS 5.1 3.0  HGB 13.2 11.3*  HCT 39.0 33.2*  MCV 90.9 91.2  PLT 243 0000000   Basic Metabolic Panel: Recent Labs  Lab 03/16/21 1956 03/17/21 0352 03/18/21 0630  NA 134* 137 136  K 3.3* 3.0* 3.2*  CL 100 106 104  CO2 23 24 24   GLUCOSE 123* 106* 110*  BUN 14 12 13   CREATININE 1.39* 1.19 1.07  CALCIUM 8.3* 7.8* 8.1*  MG  --  1.8 1.8  PHOS  --  3.3  --    Liver Function Tests: Recent Labs  Lab 03/16/21 1956  AST 22  ALT 20  ALKPHOS 57  BILITOT 0.6  PROT 6.7  ALBUMIN 3.9   CBG: No results for input(s): GLUCAP in the last 168 hours.  Discharge time spent: less than 30 minutes.  Signed: Sharen Hones, MD [facility] 03/18/2021

## 2021-03-19 DIAGNOSIS — L0291 Cutaneous abscess, unspecified: Secondary | ICD-10-CM | POA: Diagnosis not present

## 2021-03-19 DIAGNOSIS — A419 Sepsis, unspecified organism: Secondary | ICD-10-CM | POA: Diagnosis not present

## 2021-03-19 DIAGNOSIS — N179 Acute kidney failure, unspecified: Secondary | ICD-10-CM | POA: Diagnosis not present

## 2021-03-19 NOTE — Discharge Summary (Signed)
Physician Discharge Summary   Patient: Jason Cook MRN: [id number] DOB: 01-02-1966  Admit date:     03/16/2021  Discharge date: 03/19/21  Discharge Physician: Sharen Hones   PCP: Weyman Akshay, MD   Recommendations at discharge:  Recommendations at discharge:    Follow-up with PCP in 1 week. Daily shower without dressing, reapplication of a gauze and tape after shower.  Discharge Diagnoses: Principal Problem:   Sepsis ([place]) Active Problems:   Abscess   Essential hypertension   Hyperlipidemia   Influenza A   AKI (acute kidney injury) ([place])  Resolved Problems:   * No resolved hospital problems. *   Sepsis  Acute colonic diverticulitis. Lower back abscess. Status post I&D from emergency room.  Wound culture was not sent.  Blood cultures so far negative. Patient condition has improved, antibiotic changed to oral doxycycline and Augmentin for additional 4 days.  Patient will need to follow-up with PCP in 1 week.   Influenza A. Patient is taking Tamiflu.  Acute kidney injury. Hypokalemia. Renal function normalized, will give additional 40 mEq KCl before discharge.  Also prescribed 20 mg of KCl daily with concurrent use of hydrochlorothiazide.   Essential hypertension. Resumed hydrochlorothiazide.  Patient was discharged yesterday. Not able to arrange transport. No new issue today, still stable for discharge       Consultants: None Procedures performed: I&D  Disposition: Home Diet recommendation:  Discharge Diet Orders (From admission, onward)     Start     Ordered   03/18/21 0000  Diet - low sodium heart healthy        03/18/21 0931           Cardiac diet  DISCHARGE MEDICATION: Allergies as of 03/19/2021       Reactions   Ibuprofen         Medication List     STOP taking these medications    Advair HFA 115-21 MCG/ACT inhaler Generic drug: fluticasone-salmeterol       TAKE these medications    albuterol 108 (90 Base) MCG/ACT  inhaler Commonly known as: VENTOLIN HFA INHALE 2 PUFFS INTO THE LUNGS EVERY 6 HOURS AS NEEDED.   amoxicillin-clavulanate 875-125 MG tablet Commonly known as: Augmentin Take 1 tablet by mouth 2 (two) times daily for 4 days.   atorvastatin 40 MG tablet Commonly known as: LIPITOR TAKE 1 TABLET BY MOUTH DAILY AS NEEDED   doxycycline 100 MG tablet Commonly known as: VIBRA-TABS Take 1 tablet (100 mg total) by mouth every 12 (twelve) hours for 4 days.   hydrochlorothiazide 25 MG tablet Commonly known as: HYDRODIURIL TAKE 1 TABLET (25 MG TOTAL) BY MOUTH DAILY.   losartan 25 MG tablet Commonly known as: COZAAR Take 25 mg by mouth daily. What changed: Another medication with the same name was removed. Continue taking this medication, and follow the directions you see here.   oseltamivir 75 MG capsule Commonly known as: TAMIFLU Take 1 capsule (75 mg total) by mouth 2 (two) times daily for 3 days.   pantoprazole 40 MG tablet Commonly known as: PROTONIX Take 1 tablet (40 mg total) by mouth daily.   potassium chloride 10 MEQ tablet Commonly known as: KLOR-CON M Take 1 tablet (10 mEq total) by mouth 2 (two) times daily.   Symbicort 160-4.5 MCG/ACT inhaler Generic drug: budesonide-formoterol Inhale 1 puff into the lungs in the morning and at bedtime.   traZODone 100 MG tablet Commonly known as: DESYREL Take 100 mg by mouth at bedtime.  Discharge Care Instructions  (From admission, onward)           Start     Ordered   03/18/21 0000  Discharge wound care:       Comments: daily showering without a dressing, then reapplication of gauze and tape , follow with pcp   03/18/21 0931            Follow-up Information     Llamas, Waunita Schooner, MD. Go on 03/24/2021.   Specialty: Family Medicine Why: @ 10:40am Contact information: [place] [address] [phone]                 Discharge Exam: Danley Danker Weights   03/16/21 2004  03/17/21 0039  Weight: 83.9 kg 89.5 kg   General exam: Appears calm and comfortable  Respiratory system: Clear to auscultation. Respiratory effort normal. Cardiovascular system: S1 & S2 heard, RRR. No JVD, murmurs, rubs, gallops or clicks. No pedal edema. Gastrointestinal system: Abdomen is nondistended, soft and nontender. No organomegaly or masses felt. Normal bowel sounds heard. Central nervous system: Alert and oriented. No focal neurological deficits. Extremities: Symmetric 5 x 5 power. Skin: No rashes, lesions or ulcers Psychiatry: Judgement and insight appear normal. Mood & affect appropriate.    Condition at discharge: good  The results of significant diagnostics from this hospitalization (including imaging, microbiology, ancillary and laboratory) are listed below for reference.   Imaging Studies: CT HEAD WO CONTRAST (5MM)  Result Date: 03/16/2021 CLINICAL DATA:  Headache. EXAM: CT HEAD WITHOUT CONTRAST TECHNIQUE: Contiguous axial images were obtained from the base of the skull through the vertex without intravenous contrast. RADIATION DOSE REDUCTION: This exam was performed according to the departmental dose-optimization program which includes automated exposure control, adjustment of the mA and/or kV according to patient size and/or use of iterative reconstruction technique. COMPARISON:  None. FINDINGS: Brain: No evidence of acute infarction, hemorrhage, hydrocephalus, extra-axial collection or mass lesion/mass effect. Vascular: No hyperdense vessel or unexpected calcification. Skull: Normal. Negative for fracture or focal lesion. Sinuses/Orbits: No acute finding. Other: None. IMPRESSION: No acute intracranial process. Electronically Signed   By: Virgina Norfolk M.D.   On: 03/16/2021 21:22   CT ABDOMEN PELVIS W CONTRAST  Result Date: 03/16/2021 CLINICAL DATA:  Acute abdominal pain. Low back pain. Abscess, recently drained at urgent care. Evaluate deep infection. Sepsis. EXAM: CT  ABDOMEN AND PELVIS WITH CONTRAST TECHNIQUE: Multidetector CT imaging of the abdomen and pelvis was performed using the standard protocol following bolus administration of intravenous contrast. RADIATION DOSE REDUCTION: This exam was performed according to the departmental dose-optimization program which includes automated exposure control, adjustment of the mA and/or kV according to patient size and/or use of iterative reconstruction technique. CONTRAST:  17mL OMNIPAQUE IOHEXOL 300 MG/ML  SOLN COMPARISON:  CT 04/21/2018 FINDINGS: Lower chest: Emphysema. No pleural effusion or focal consolidation. Heart is normal in size. Hepatobiliary: Punctate hypodensity in the dome of the liver is too small to characterize. No suspicious liver lesion. Gallbladder physiologically distended, no calcified stone. No biliary dilatation. Pancreas: No ductal dilatation or inflammation. Spleen: Normal in size without focal abnormality. Anterior splenule adjacent to the pancreatic tail, unchanged. Adrenals/Urinary Tract: Left greater than right adrenal thickening without dominant adrenal nodule, unchanged. No hydronephrosis or perinephric edema. Homogeneous renal enhancement with symmetric excretion on delayed phase imaging. Small subcentimeter hypodensities in the lower right kidney and anterior mid left are too small to characterize but likely small cysts. No renal calculi. Urinary bladder is physiologically distended  without wall thickening. Stomach/Bowel: Small hiatal hernia. The remainder of the stomach is unremarkable. There is no small bowel obstruction or inflammation. Prior appendectomy. There is diffuse colonic diverticulosis. There is mild fat stranding about the descending proximal sigmoid colon junction in the region of multiple diverticula, suspicious for mild acute diverticulitis, for example series 2, image 69. No focally inflamed diverticulum is seen. Vascular/Lymphatic: Normal caliber abdominal aorta with mild  atherosclerosis. Patent portal and mesenteric veins. No acute vascular findings. No enlarged lymph nodes in the abdomen or pelvis. Reproductive: Prostate is unremarkable. Other: There fat in both inguinal canals. Small fat containing umbilical hernia. No free air, free fluid, or intra-abdominal fluid collection. Small elongated fluid collections centered in the skin and subcutaneous tissues of the back to just to the right of midline at the level of L2 (patient has transitional lumbosacral anatomy). This measures approximately 11 x 4 x 18 mm with some peripheral enhancement, series 2, image 38. There is no internal air. No soft tissue gas. Extension into the deep soft tissues. There is usual dependent midline edema. Musculoskeletal: There are no acute or suspicious osseous abnormalities. Remote left posterior eleventh and twelfth rib fractures. IMPRESSION: 1. Mild fat stranding about the descending proximal sigmoid colon junction in the region of multiple diverticula, suspicious for mild acute diverticulitis. No focally inflamed diverticulum is seen. 2. Small elongated fluid collections centered in the skin and subcutaneous tissues of the back to just to the right of midline measures approximately 11 x 4 x 18 mm with some peripheral enhancement. No extension into the deep soft tissues. 3. Small hiatal hernia. Aortic Atherosclerosis (ICD10-I70.0) and Emphysema (ICD10-J43.9). Electronically Signed   By: Keith Rake M.D.   On: 03/16/2021 21:30   DG Chest Port 1 View  Result Date: 03/16/2021 CLINICAL DATA:  Possible sepsis, left-sided chest pain EXAM: PORTABLE CHEST 1 VIEW COMPARISON:  05/31/2020 FINDINGS: Cardiac shadow is within normal limits. Lungs are well aerated bilaterally. No focal infiltrate or effusion is seen. No bony abnormality is noted. IMPRESSION: No active disease. Electronically Signed   By: Inez Catalina M.D.   On: 03/16/2021 20:39    Microbiology: Results for orders placed or performed  during the hospital encounter of 03/16/21  Blood culture (routine x 2)     Status: None (Preliminary result)   Collection Time: 03/16/21  7:56 PM   Specimen: BLOOD  Result Value Ref Range Status   Specimen Description BLOOD RIGHT ASSIST CONTROL  Final   Special Requests   Final    BOTTLES DRAWN AEROBIC AND ANAEROBIC Blood Culture adequate volume   Culture   Final    NO GROWTH 3 DAYS Performed at [facility], [address], [place], [place] [address]    Report Status PENDING  Incomplete  Blood culture (routine x 2)     Status: None (Preliminary result)   Collection Time: 03/16/21  7:56 PM   Specimen: BLOOD  Result Value Ref Range Status   Specimen Description BLOOD RIGHT FOREARM  Final   Special Requests   Final    BOTTLES DRAWN AEROBIC AND ANAEROBIC Blood Culture adequate volume   Culture   Final    NO GROWTH 3 DAYS Performed at [facility], [address] [place], [place] [address]    Report Status PENDING  Incomplete  Resp Panel by RT-PCR (Flu A&B, Covid) Nasopharyngeal Swab     Status: Abnormal   Collection Time: 03/16/21  8:13 PM   Specimen: Nasopharyngeal Swab; Nasopharyngeal(NP) swabs  in vial transport medium  Result Value Ref Range Status   SARS Coronavirus 2 by RT PCR NEGATIVE NEGATIVE Final    Comment: (NOTE) SARS-CoV-2 target nucleic acids are NOT DETECTED.  The SARS-CoV-2 RNA is generally detectable in upper respiratory specimens during the acute phase of infection. The lowest concentration of SARS-CoV-2 viral copies this assay can detect is 138 copies/mL. A negative result does not preclude SARS-Cov-2 infection and should not be used as the sole basis for treatment or other patient management decisions. A negative result may occur with  improper specimen collection/handling, submission of specimen other than nasopharyngeal swab, presence of viral mutation(s) within the areas targeted by this assay, and inadequate number of  viral copies(<138 copies/mL). A negative result must be combined with clinical observations, patient history, and epidemiological information. The expected result is Negative.  Fact Sheet for Patients:  EntrepreneurPulse.com.au  Fact Sheet for Healthcare Providers:  IncredibleEmployment.be  This test is no t yet approved or cleared by the [place] FDA and  has been authorized for detection and/or diagnosis of SARS-CoV-2 by FDA under an Emergency Use Authorization (EUA). This EUA will remain  in effect (meaning this test can be used) for the duration of the COVID-19 declaration under Section 564(b)(1) of the Act, 21 U.S.C.section 360bbb-3(b)(1), unless the authorization is terminated  or revoked sooner.       Influenza A by PCR POSITIVE (A) NEGATIVE Final   Influenza B by PCR NEGATIVE NEGATIVE Final    Comment: (NOTE) The Xpert Xpress SARS-CoV-2/FLU/RSV plus assay is intended as an aid in the diagnosis of influenza from Nasopharyngeal swab specimens and should not be used as a sole basis for treatment. Nasal washings and aspirates are unacceptable for Xpert Xpress SARS-CoV-2/FLU/RSV testing.  Fact Sheet for Patients: EntrepreneurPulse.com.au  Fact Sheet for Healthcare Providers: IncredibleEmployment.be  This test is not yet approved or cleared by the [place] FDA and has been authorized for detection and/or diagnosis of SARS-CoV-2 by FDA under an Emergency Use Authorization (EUA). This EUA will remain in effect (meaning this test can be used) for the duration of the COVID-19 declaration under Section 564(b)(1) of the Act, 21 U.S.C. section 360bbb-3(b)(1), unless the authorization is terminated or revoked.  Performed at [facility], [address] [place], [place] [address]     Labs: CBC: Recent Labs  Lab 03/16/21 1956 03/17/21 0352  WBC 6.3 4.3  NEUTROABS 5.1 3.0  HGB  13.2 11.3*  HCT 39.0 33.2*  MCV 90.9 91.2  PLT 243 0000000   Basic Metabolic Panel: Recent Labs  Lab 03/16/21 1956 03/17/21 0352 03/18/21 0630  NA 134* 137 136  K 3.3* 3.0* 3.2*  CL 100 106 104  CO2 23 24 24   GLUCOSE 123* 106* 110*  BUN 14 12 13   CREATININE 1.39* 1.19 1.07  CALCIUM 8.3* 7.8* 8.1*  MG  --  1.8 1.8  PHOS  --  3.3  --    Liver Function Tests: Recent Labs  Lab 03/16/21 1956  AST 22  ALT 20  ALKPHOS 57  BILITOT 0.6  PROT 6.7  ALBUMIN 3.9   CBG: No results for input(s): GLUCAP in the last 168 hours.  Discharge time spent: less than 30 minutes.  Signed: Sharen Hones, MD [facility] 03/19/2021

## 2021-03-19 NOTE — Progress Notes (Signed)
Discharge Note: Reviewed discharge instructions with pt. Pt verbalized understanding. PT discharged with medications from [facility] pharmacy until Monday. Pt discharged with all personal belongings.  Staff Wheeled pt out. Pt transported to home via  [facility].

## 2021-03-19 NOTE — TOC CM/SW Note (Signed)
Notified by 1A Director that patient was supposed to DC yesterday but [place] [facility] said they had no taxi drivers available. CSW called [employer]. Spoke to [employer] Onalee Hua stated they have 2 drivers today and should be able to take patient home after 12. David requested we call back after 12 to get patient's ride set up. Notified Bedside LPN and 1A Director.  Alfonso Ramus, [place] [phone]

## 2021-03-21 LAB — CULTURE, BLOOD (ROUTINE X 2)
Culture: NO GROWTH
Culture: NO GROWTH
Special Requests: ADEQUATE
Special Requests: ADEQUATE

## 2023-09-25 ENCOUNTER — Ambulatory Visit: Admission: EM | Admit: 2023-09-25 | Discharge: 2023-09-25 | Disposition: A | Discharge status: Home / Self Care

## 2023-09-25 ENCOUNTER — Encounter: Payer: Self-pay | Admitting: Emergency Medicine

## 2023-09-25 DIAGNOSIS — F1010 Alcohol abuse, uncomplicated: Secondary | ICD-10-CM

## 2023-09-25 DIAGNOSIS — H60332 Swimmer's ear, left ear: Secondary | ICD-10-CM | POA: Diagnosis not present

## 2023-09-25 DIAGNOSIS — H9202 Otalgia, left ear: Secondary | ICD-10-CM

## 2023-09-25 DIAGNOSIS — F17200 Nicotine dependence, unspecified, uncomplicated: Secondary | ICD-10-CM | POA: Diagnosis not present

## 2023-09-25 MED ORDER — CIPRO HC 0.2-1 % OT SUSP: 3.0000 [drp] | Freq: Two times a day (BID) | OTIC | 0 refills | Status: DC

## 2023-09-25 NOTE — ED Triage Notes (Signed)
 Pt presents with left ear pain and ear drainage x 4 days. Pt denies any other symptoms.

## 2023-09-25 NOTE — ED Provider Notes (Signed)
 [facility]    CSN: [id number] Arrival date & time: 09/25/23  1713      History   Chief Complaint Chief Complaint  Patient presents with   Otalgia    HPI Jason Cook is a 58 y.o. male.   Jason Cook, 57, presents to urgent care for evaluation of left ear pain x 5 days with left ear swelling, painful to touch, clear drainage around outer edge of ear noted.  Patient denies any known injury or trauma, patient states he does not use Q-tips or Bobby pins, has had this happen before.  Patient denies any fever or dizziness  Pt drinks 1/2 ppd and drinks beer daily~6 per pt report  Ddx: COPD, Depression, HTN,GERD  The history is provided by the patient. No language interpreter was used.    Past Medical History:  Diagnosis Date   COPD (chronic obstructive pulmonary disease) (HCC)    Depression    GERD (gastroesophageal reflux disease)    Hypertension     Patient Active Problem List   Diagnosis Date Noted   Otalgia of left ear 09/25/2023   Acute swimmer's ear of left side 09/25/2023   Smoker 09/25/2023   Alcohol abuse 09/25/2023   Influenza A 03/17/2021   AKI (acute kidney injury) (H[facility]) 03/17/2021   Sepsis (H[facility]) 03/16/2021   Abscess 03/16/2021   Essential hypertension 03/16/2021   Hyperlipidemia 03/16/2021   Umbilical hernia without obstruction and without gangrene    Acute appendicitis 04/21/2018    Past Surgical History:  Procedure Laterality Date   APPENDECTOMY     collapse lung N/A 2015   HERNIA REPAIR     LAPAROSCOPIC APPENDECTOMY  04/21/2018   Procedure: APPENDECTOMY LAPAROSCOPIC;  Surgeon: Desiderio Schanz, MD;  Location: [facility] ORS;  Service: General;;   UMBILICAL HERNIA REPAIR N/A 04/21/2018   Procedure: HERNIA REPAIR UMBILICAL ADULT;  Surgeon: Desiderio Schanz, MD;  Location: [facility] ORS;  Service: General;  Laterality: N/A;       Home Medications    Prior to Admission medications   Medication Sig Start Date End Date Taking? Authorizing Provider   ciprofloxacin -hydrocortisone (CIPRO  HC) OTIC suspension Place 3 drops into the left ear 2 (two) times daily. 09/25/23  Yes Akela Pocius, Rilla, NP  albuterol  (VENTOLIN  HFA) 108 (90 Base) MCG/ACT inhaler INHALE 2 PUFFS INTO THE LUNGS EVERY 6 HOURS AS NEEDED. 02/14/21   Joshua Cathryne BROCKS, MD  atorvastatin  (LIPITOR) 40 MG tablet TAKE 1 TABLET BY MOUTH DAILY AS NEEDED 01/19/21   Joshua Cathryne BROCKS, MD  buPROPion  (WELLBUTRIN  SR) 150 MG 12 hr tablet Take 150 mg by mouth.    [provider]  hydrochlorothiazide  (HYDRODIURIL ) 25 MG tablet TAKE 1 TABLET (25 MG TOTAL) BY MOUTH DAILY. 01/19/21   Joshua Cathryne BROCKS, MD  losartan  (COZAAR ) 25 MG tablet Take 25 mg by mouth daily. 03/16/21   [provider]  pantoprazole  (PROTONIX ) 40 MG tablet Take 1 tablet (40 mg total) by mouth daily. 11/05/20   Joshua Cathryne BROCKS, MD  potassium chloride  (KLOR-CON  M) 10 MEQ tablet Take 1 tablet (10 mEq total) by mouth 2 (two) times daily. 03/18/21   Laurita Pillion, MD  SYMBICORT 160-4.5 MCG/ACT inhaler Inhale 1 puff into the lungs in the morning and at bedtime. 03/16/21   [provider]  traZODone (DESYREL) 100 MG tablet Take 100 mg by mouth at bedtime. 01/27/21   [provider]    Family History Family History  Problem Relation Age of Onset   Hypertension Mother  Diabetes Mother    Cancer Mother    Heart disease Father    Hypertension Father    Cancer Father    Cancer Maternal Grandmother    Cancer Maternal Grandfather    Cancer Paternal Grandmother    Cancer Paternal Grandfather     Social History Social History   Tobacco Use   Smoking status: Every Day    Current packs/day: 0.50    Average packs/day: 0.5 packs/day for 42.0 years (21.0 ttl pk-yrs)    Types: Cigarettes   Smokeless tobacco: Never  Vaping Use   Vaping status: Never Used  Substance Use Topics   Alcohol use: Yes    Alcohol/week: 8.0 standard drinks of alcohol    Types: 6 Cans of beer, 2 Shots of liquor per week     Comment: daily   Drug use: Yes    Types: Marijuana    Comment: daily     Allergies   Ibuprofen    Review of Systems Review of Systems  Constitutional:  Negative for fever.  HENT:  Positive for ear discharge and ear pain.        Left ear swelling  All other systems reviewed and are negative.    Physical Exam Triage Vital Signs ED Triage Vitals  Encounter Vitals Group     BP      Girls Systolic BP Percentile      Girls Diastolic BP Percentile      Boys Systolic BP Percentile      Boys Diastolic BP Percentile      Pulse      Resp      Temp      Temp src      SpO2      Weight      Height      Head Circumference      Peak Flow      Pain Score      Pain Loc      Pain Education      Exclude from Growth Chart    No data found.  Updated Vital Signs BP (!) 175/97 (BP Location: Right Arm)   Pulse 84   Temp 98.5 F (36.9 C) (Oral)   Resp 16   Wt 170 lb (77.1 kg)   SpO2 96%   BMI 24.39 kg/m   Visual Acuity Right Eye Distance:   Left Eye Distance:   Bilateral Distance:    Right Eye Near:   Left Eye Near:    Bilateral Near:     Physical Exam Vitals and nursing note reviewed.  Constitutional:      Appearance: He is well-developed and well-groomed.  HENT:     Head: Normocephalic.     Right Ear: Swelling and tenderness present. No mastoid tenderness. Tympanic membrane is not perforated.     Ears:     Comments: Left canal and pinna inflamed, no mastoid tenderness or erythema noted, TMs bilaterally intact Neurological:     General: No focal deficit present.     Mental Status: He is alert and oriented to person, place, and time.     GCS: GCS eye subscore is 4. GCS verbal subscore is 5. GCS motor subscore is 6.  Psychiatric:        Attention and Perception: Attention normal.        Mood and Affect: Mood normal.        Speech: Speech normal.        Behavior: Behavior is cooperative.  UC Treatments / Results  Labs (all labs ordered are listed, but  only abnormal results are displayed) Labs Reviewed - No data to display  EKG   Radiology No results found.  Procedures Procedures (including critical care time)  Medications Ordered in UC Medications - No data to display  Initial Impression / Assessment and Plan / UC Course  I have reviewed the triage vital signs and the nursing notes.  Pertinent labs & imaging results that were available during my care of the patient were reviewed by me and considered in my medical decision making (see chart for details).     Ddx: Left otalgia, acute otitis externa, allergies , mastoiditis , smoker, alcohol abuse Final Clinical Impressions(s) / UC Diagnoses   Final diagnoses:  Otalgia of left ear  Acute swimmer's ear of left side  Smoker  Alcohol abuse     Discharge Instructions      Use ear drops as directed, no water or q tips/bobby pins in ears If you have new or worsening issues go to [facility]-call for appt Or ER for further evaluation Stop smoking, drinking, follow up with PCP     ED Prescriptions     Medication Sig Dispense Auth. Provider   ciprofloxacin -hydrocortisone (CIPRO  HC) OTIC suspension Place 3 drops into the left ear 2 (two) times daily. 10 mL Talya Quain, Rilla, NP      PDMP not reviewed this encounter.   Aminta Rilla, NP 09/25/23 2035

## 2023-09-25 NOTE — Discharge Instructions (Addendum)
 Use ear drops as directed, no water or q tips/bobby pins in ears If you have new or worsening issues go to [facility]-call for appt Or ER for further evaluation Stop smoking, drinking, follow up with PCP

## 2023-09-26 ENCOUNTER — Telehealth: Payer: Self-pay | Admitting: Family Medicine

## 2023-09-26 MED ORDER — CIPROFLOXACIN-DEXAMETHASONE 0.3-0.1 % OT SUSP: 4.0000 [drp] | Freq: Two times a day (BID) | OTIC | 0 refills | Status: AC

## 2023-09-26 NOTE — Telephone Encounter (Signed)
 Insurance didn't cover Cipro -hydrocortisone. Switch to Ciprodex  instead.   Caprice Porteous, DO

## 2023-10-06 ENCOUNTER — Ambulatory Visit (INDEPENDENT_AMBULATORY_CARE_PROVIDER_SITE_OTHER)

## 2023-10-06 ENCOUNTER — Encounter: Payer: Self-pay | Admitting: Emergency Medicine

## 2023-10-06 ENCOUNTER — Ambulatory Visit
Admission: EM | Admit: 2023-10-06 | Discharge: 2023-10-06 | Disposition: A | Discharge status: Home / Self Care | Attending: Emergency Medicine | Admitting: Emergency Medicine

## 2023-10-06 DIAGNOSIS — J432 Centrilobular emphysema: Secondary | ICD-10-CM | POA: Insufficient documentation

## 2023-10-06 DIAGNOSIS — J441 Chronic obstructive pulmonary disease with (acute) exacerbation: Secondary | ICD-10-CM | POA: Diagnosis not present

## 2023-10-06 DIAGNOSIS — R051 Acute cough: Secondary | ICD-10-CM | POA: Insufficient documentation

## 2023-10-06 DIAGNOSIS — J069 Acute upper respiratory infection, unspecified: Secondary | ICD-10-CM | POA: Insufficient documentation

## 2023-10-06 DIAGNOSIS — H66002 Acute suppurative otitis media without spontaneous rupture of ear drum, left ear: Secondary | ICD-10-CM | POA: Diagnosis not present

## 2023-10-06 LAB — SARS CORONAVIRUS 2 BY RT PCR: SARS Coronavirus 2 by RT PCR: NEGATIVE

## 2023-10-06 MED ORDER — IPRATROPIUM BROMIDE 0.06 % NA SOLN: 2.0000 | Freq: Four times a day (QID) | NASAL | 12 refills | Status: AC

## 2023-10-06 MED ORDER — PREDNISONE 20 MG PO TABS: 60.0000 mg | ORAL_TABLET | Freq: Every day | ORAL | 0 refills | Status: AC

## 2023-10-06 MED ORDER — BENZONATATE 100 MG PO CAPS: 200.0000 mg | ORAL_CAPSULE | Freq: Three times a day (TID) | ORAL | 0 refills | Status: AC

## 2023-10-06 MED ORDER — PROMETHAZINE-DM 6.25-15 MG/5ML PO SYRP: 5.0000 mL | ORAL_SOLUTION | Freq: Four times a day (QID) | ORAL | 0 refills | Status: AC | PRN

## 2023-10-06 MED ORDER — AEROCHAMBER MV MISC: 2 refills | Status: AC

## 2023-10-06 MED ORDER — LEVOFLOXACIN 500 MG PO TABS: 500.0000 mg | ORAL_TABLET | Freq: Every day | ORAL | 0 refills | Status: AC

## 2023-10-06 MED ORDER — DEXAMETHASONE SODIUM PHOSPHATE 10 MG/ML IJ SOLN
10.0000 mg | Freq: Once | INTRAMUSCULAR | Status: AC
Start: 2023-10-06 — End: 2023-10-06
  Administered 2023-10-06: 10 mg via INTRAMUSCULAR

## 2023-10-06 MED ORDER — ALBUTEROL SULFATE HFA 108 (90 BASE) MCG/ACT IN AERS: 1.0000 | INHALATION_SPRAY | Freq: Four times a day (QID) | RESPIRATORY_TRACT | 0 refills | Status: DC | PRN

## 2023-10-06 NOTE — Discharge Instructions (Addendum)
 Your chest x-ray does not show any acute findings but your physical exam is consistent with an upper respiratory infection.  I believe this upper respiratory tract infection has exacerbated your COPD.  Use the albuterol  inhaler, with the spacer, and take 1 to 2 puffs every 4-6 hours as needed for shortness of breath or wheezing.  Starting tomorrow morning begin taking prednisone  60 mg for the next 5 days.  This will help decrease inflammation in your lungs and should improve your breathing.  Take the Levaquin  500 mg once daily for 7 days for treatment of your upper respiratory tract infection, left otitis media, and COPD exacerbation.  Use the Atrovent  nasal spray, 2 squirts in each nostril every 6 hours, as needed for runny nose and postnasal drip.  Use the Tessalon  Perles every 8 hours during the day.  Take them with a small sip of water.  They may give you some numbness to the base of your tongue or a metallic taste in your mouth, this is normal.  Use the Promethazine  DM cough syrup at bedtime for cough and congestion.  It will make you drowsy so do not take it during the day.  Return for reevaluation or see your primary care provider for any new or worsening symptoms.

## 2023-10-06 NOTE — ED Triage Notes (Signed)
 Patient c/o cough, nasal congestion and fatigue that started on Wed.  Patient has been taking OTC medicine Mucinex.  Patient unsure of fevers.

## 2023-10-06 NOTE — ED Provider Notes (Signed)
 [facility]    CSN: [id number] Arrival date & time: 10/06/23  9041      History   Chief Complaint Chief Complaint  Patient presents with   Nasal Congestion   Cough    HPI Jason Cook is a 58 y.o. male.   HPI  58 year old male past medical history significant for hypertension, GERD, and COPD presents for evaluation of respiratory issues that started 3 days ago.  He also reports that he has been dealing with an ear infection on the left for 2 weeks and he is taking antibiotic eardrops without any improvement of pain.  He is unsure if he has had any fevers.  He describes his nasal discharge and sputum production as being a thick brown mucus.  He has had ear pain on the left.  He does endorse shortness of breath and wheezing.  No sore throat.  Past Medical History:  Diagnosis Date   COPD (chronic obstructive pulmonary disease) (HCC)    Depression    GERD (gastroesophageal reflux disease)    Hypertension     Patient Active Problem List   Diagnosis Date Noted   Otalgia of left ear 09/25/2023   Acute swimmer's ear of left side 09/25/2023   Smoker 09/25/2023   Alcohol abuse 09/25/2023   Influenza A 03/17/2021   AKI (acute kidney injury) (HCC) 03/17/2021   Sepsis (HCC) 03/16/2021   Abscess 03/16/2021   Essential hypertension 03/16/2021   Hyperlipidemia 03/16/2021   Umbilical hernia without obstruction and without gangrene    Acute appendicitis 04/21/2018    Past Surgical History:  Procedure Laterality Date   APPENDECTOMY     collapse lung N/A 2015   HERNIA REPAIR     LAPAROSCOPIC APPENDECTOMY  04/21/2018   Procedure: APPENDECTOMY LAPAROSCOPIC;  Surgeon: Desiderio Schanz, MD;  Location: [facility] ORS;  Service: General;;   UMBILICAL HERNIA REPAIR N/A 04/21/2018   Procedure: HERNIA REPAIR UMBILICAL ADULT;  Surgeon: Desiderio Schanz, MD;  Location: [facility] OR[facility];  Service: General;  Laterality: N/A;       Home Medications    Prior to Admission medications    Medication Sig Start Date End Date Taking? Authorizing Provider  benzonatate  (TESSALON ) 100 MG capsule Take 2 capsules (200 mg total) by mouth every 8 (eight) hours. 10/06/23  Yes Bernardino Ditch, NP  ipratropium (ATROVENT ) 0.06 % nasal spray Place 2 sprays into both nostrils 4 (four) times daily. 10/06/23  Yes Bernardino Ditch, NP  levofloxacin  (LEVAQUIN ) 500 MG tablet Take 1 tablet (500 mg total) by mouth daily. 10/06/23  Yes Bernardino Ditch, NP  predniSONE  (DELTASONE ) 20 MG tablet Take 3 tablets (60 mg total) by mouth daily with breakfast for 5 days. 3 tablets daily for 5 days. 10/06/23 10/11/23 Yes Bernardino Ditch, NP  promethazine -dextromethorphan (PROMETHAZINE -DM) 6.25-15 MG/5ML syrup Take 5 mLs by mouth 4 (four) times daily as needed. 10/06/23  Yes Bernardino Ditch, NP  Spacer/Aero-Holding Chambers (AEROCHAMBER MV) inhaler Use as instructed 10/06/23  Yes Bernardino Ditch, NP  albuterol  (VENTOLIN  HFA) 108 (90 Base) MCG/ACT inhaler Inhale 1-2 puffs into the lungs every 6 (six) hours as needed. 10/06/23   Bernardino Ditch, NP  atorvastatin  (LIPITOR) 40 MG tablet TAKE 1 TABLET BY MOUTH DAILY AS NEEDED 01/19/21   Joshua Cathryne BROCKS, MD  buPROPion  (WELLBUTRIN  SR) 150 MG 12 hr tablet Take 150 mg by mouth.    [provider]  hydrochlorothiazide  (HYDRODIURIL ) 25 MG tablet TAKE 1 TABLET (25 MG TOTAL) BY MOUTH DAILY. 01/19/21   Joshua Cathryne BROCKS, MD  losartan  (COZAAR ) 25 MG tablet Take 25 mg by mouth daily. 03/16/21   [provider]  pantoprazole  (PROTONIX ) 40 MG tablet Take 1 tablet (40 mg total) by mouth daily. 11/05/20   Joshua Cathryne BROCKS, MD  potassium chloride  (KLOR-CON  M) 10 MEQ tablet Take 1 tablet (10 mEq total) by mouth 2 (two) times daily. 03/18/21   Laurita Pillion, MD  SYMBICORT 160-4.5 MCG/ACT inhaler Inhale 1 puff into the lungs in the morning and at bedtime. 03/16/21   [provider]  traZODone (DESYREL) 100 MG tablet Take 100 mg by mouth at bedtime. 01/27/21   [provider]    Family  History Family History  Problem Relation Age of Onset   Hypertension Mother    Diabetes Mother    Cancer Mother    Heart disease Father    Hypertension Father    Cancer Father    Cancer Maternal Grandmother    Cancer Maternal Grandfather    Cancer Paternal Grandmother    Cancer Paternal Grandfather     Social History Social History   Tobacco Use   Smoking status: Every Day    Current packs/day: 0.50    Average packs/day: 0.5 packs/day for 42.0 years (21.0 ttl pk-yrs)    Types: Cigarettes   Smokeless tobacco: Never  Vaping Use   Vaping status: Never Used  Substance Use Topics   Alcohol use: Yes    Alcohol/week: 8.0 standard drinks of alcohol    Types: 6 Cans of beer, 2 Shots of liquor per week    Comment: daily   Drug use: Yes    Types: Marijuana    Comment: daily     Allergies   Ibuprofen    Review of Systems Review of Systems  Constitutional:  Positive for fatigue. Negative for fever.  HENT:  Positive for congestion, ear pain and rhinorrhea. Negative for sore throat.   Respiratory:  Positive for cough, shortness of breath and wheezing.      Physical Exam Triage Vital Signs ED Triage Vitals  Encounter Vitals Group     BP      Girls Systolic BP Percentile      Girls Diastolic BP Percentile      Boys Systolic BP Percentile      Boys Diastolic BP Percentile      Pulse      Resp      Temp      Temp src      SpO2      Weight      Height      Head Circumference      Peak Flow      Pain Score      Pain Loc      Pain Education      Exclude from Growth Chart    No data found.  Updated Vital Signs BP (!) 132/90 (BP Location: Left Arm)   Pulse 72   Temp 98.3 F (36.8 C) (Oral)   Resp 16   Ht 5' 10 (1.778 m)   Wt 170 lb (77.1 kg)   SpO2 99%   BMI 24.39 kg/m   Visual Acuity Right Eye Distance:   Left Eye Distance:   Bilateral Distance:    Right Eye Near:   Left Eye Near:    Bilateral Near:     Physical Exam Vitals and nursing note  reviewed.  Constitutional:      Appearance: He is ill-appearing.  HENT:     Head: Normocephalic and atraumatic.  Right Ear: Tympanic membrane, ear canal and external ear normal. There is no impacted cerumen.     Left Ear: Ear canal and external ear normal. There is no impacted cerumen.     Ears:     Comments: Left TM is erythematous and injected.  Mild edema to the left external auditory canal without appreciable erythema.  Right EAC and TM are normal in appearance.    Nose: Congestion and rhinorrhea present.     Comments: Patient mucosa is edematous and erythematous with thick brownish-yellow discharge in both nares.  Bilateral maxillary sinuses are tender to compression    Mouth/Throat:     Mouth: Mucous membranes are moist.     Pharynx: Oropharynx is clear. Posterior oropharyngeal erythema present. No oropharyngeal exudate.     Comments: Erythema to the posterior oropharynx. Cardiovascular:     Rate and Rhythm: Normal rate and regular rhythm.     Pulses: Normal pulses.     Heart sounds: Normal heart sounds. No murmur heard.    No friction rub. No gallop.  Pulmonary:     Effort: Pulmonary effort is normal.     Breath sounds: Wheezing and rhonchi present. No rales.     Comments: Diffuse expiratory wheezing and rhonchi.  Patient also has audible wheezing in the exam room Musculoskeletal:     Cervical back: Normal range of motion and neck supple. No tenderness.  Lymphadenopathy:     Cervical: No cervical adenopathy.  Skin:    General: Skin is warm and dry.     Capillary Refill: Capillary refill takes less than 2 seconds.     Findings: No rash.  Neurological:     General: No focal deficit present.     Mental Status: He is alert and oriented to person, place, and time.      UC Treatments / Results  Labs (all labs ordered are listed, but only abnormal results are displayed) Labs Reviewed  SARS CORONAVIRUS 2 BY RT PCR    EKG   Radiology DG Chest 2 View Result Date:  10/06/2023 EXAM: 2 VIEW(S) XRAY OF THE CHEST 10/06/2023 10:49:00 AM COMPARISON: 03/16/2021 CLINICAL HISTORY: Productive cough x 3 days. Patient is a smoker. Audible wheezing. Patient c/o cough, nasal congestion and fatigue that started on Wed. Patient has been taking OTC medicine Mucinex. Patient unsure of fevers. FINDINGS: LUNGS AND PLEURA: No focal pulmonary opacity. No pulmonary edema. No pleural effusion. No pneumothorax. Hyperinflation. HEART AND MEDIASTINUM: No acute abnormality of the cardiac and mediastinal silhouettes. BONES AND SOFT TISSUES: No acute osseous abnormality. Thoracic degeneration. IMPRESSION: 1. No acute findings. 2. Hyperinflation. Electronically signed by: Waddell Calk MD 10/06/2023 11:30 AM EDT RP Workstation: [id number]    Procedures Procedures (including critical care time)  Medications Ordered in UC Medications  dexamethasone  (DECADRON ) injection 10 mg (has no administration in time range)    Initial Impression / Assessment and Plan / UC Course  I have reviewed the triage vital signs and the nursing notes.  Pertinent labs & imaging results that were available during my care of the patient were reviewed by me and considered in my medical decision making (see chart for details).   Patient is a pleasant, though ill-appearing, 58 year old male presenting for evaluation of 3 days worth of respiratory symptoms as outlined in the HPI above.  He has also been dealing with an ear infection on the left-hand side for the past 2 weeks.  He was seen on 09/25/2023 and diagnosed with otalgia of the left ear  and started on Cipro  HC.  He reports that he has been using eardrops without any improvement of symptoms.  Otoscopic examination reveals some mild edema of the left EAC without appreciable erythema.  However, the left TM is erythematous and injected.  The patient also has edema and erythema of the nasal mucosa with thick brown discharge in both naris.  Maxillary sinus tenderness.   Oropharyngeal exam revealed erythema to the posterior oropharynx.  The patient is able to speak in full sentences though does have mild dyspnea and audible wheezing.  His oxygen saturation on room air is 99%.  He is afebrile with oral temp of 98.3.  Cardiopulmonary exam reveals diffuse expiratory wheezing and rhonchi.  I will obtain a COVID PCR and a chest x-ray to evaluate any acute cardiopulmonary pathology.  I will not test for influenza at this time given that he is on day 3 of symptoms and he is outside the therapeutic window for antivirals.  COVID PCR is negative.  Chest x-ray independently reviewed and evaluated by me.  Impression: Questionable pleural effusion in the lower left lung anterior.  Patchiness is also present in the middle aspect of the left lower lobe.  Cardiomediastinal silhouette appears normal.  Radiology read is pending. Radiology impression states no acute findings.  Hyperinflation.  I will discharge patient home with a diagnosis of URI with cough, and congestion, acute otitis media on the left, and COPD exacerbation.  I will start the patient on Levaquin  500 milligrams once daily for 7 days and prednisone  60 mg once daily for 5 days to help decrease his pulmonary inflammation.  I will have staff administer 10 mg of Decadron  here in clinic and he can start the prednisone  in the morning.  He has a prescription in epic from 2023 for an albuterol  inhaler.  I will refill that and encouraged him to use it with a spacer and take 1 to 2 puffs every 4-6 hours as needed for shortness of breath or wheezing.  Additionally, I will order at your nasal spray help with nasal congestion and Tessalon  Perles and Promethazine  DM cough syrup for cough and congestion.  Final Clinical Impressions(s) / UC Diagnoses   Final diagnoses:  Acute cough  COPD exacerbation (HCC)  URI with cough and congestion  Non-recurrent acute suppurative otitis media of left ear without spontaneous rupture of tympanic  membrane     Discharge Instructions      Your chest x-ray does not show any acute findings but your physical exam is consistent with an upper respiratory infection.  I believe this upper respiratory tract infection has exacerbated your COPD.  Use the albuterol  inhaler, with the spacer, and take 1 to 2 puffs every 4-6 hours as needed for shortness of breath or wheezing.  Starting tomorrow morning begin taking prednisone  60 mg for the next 5 days.  This will help decrease inflammation in your lungs and should improve your breathing.  Take the Levaquin  500 mg once daily for 7 days for treatment of your upper respiratory tract infection, left otitis media, and COPD exacerbation.  Use the Atrovent  nasal spray, 2 squirts in each nostril every 6 hours, as needed for runny nose and postnasal drip.  Use the Tessalon  Perles every 8 hours during the day.  Take them with a small sip of water.  They may give you some numbness to the base of your tongue or a metallic taste in your mouth, this is normal.  Use the Promethazine  DM cough syrup  at bedtime for cough and congestion.  It will make you drowsy so do not take it during the day.  Return for reevaluation or see your primary care provider for any new or worsening symptoms.      ED Prescriptions     Medication Sig Dispense Auth. Provider   albuterol  (VENTOLIN  HFA) 108 (90 Base) MCG/ACT inhaler Inhale 1-2 puffs into the lungs every 6 (six) hours as needed. 18 g Bernardino Ditch, NP   Spacer/Aero-Holding Chambers (AEROCHAMBER MV) inhaler Use as instructed 1 each Bernardino Ditch, NP   benzonatate  (TESSALON ) 100 MG capsule Take 2 capsules (200 mg total) by mouth every 8 (eight) hours. 21 capsule Bernardino Ditch, NP   ipratropium (ATROVENT ) 0.06 % nasal spray Place 2 sprays into both nostrils 4 (four) times daily. 15 mL Bernardino Ditch, NP   levofloxacin  (LEVAQUIN ) 500 MG tablet Take 1 tablet (500 mg total) by mouth daily. 7 tablet Bernardino Ditch, NP    promethazine -dextromethorphan (PROMETHAZINE -DM) 6.25-15 MG/5ML syrup Take 5 mLs by mouth 4 (four) times daily as needed. 118 mL Bernardino Ditch, NP   predniSONE  (DELTASONE ) 20 MG tablet Take 3 tablets (60 mg total) by mouth daily with breakfast for 5 days. 3 tablets daily for 5 days. 15 tablet Bernardino Ditch, NP      PDMP not reviewed this encounter.   Bernardino Ditch, NP 10/06/23 1148

## 2024-02-01 ENCOUNTER — Telehealth: Payer: Self-pay

## 2024-02-02 ENCOUNTER — Ambulatory Visit
Admission: EM | Admit: 2024-02-02 | Discharge: 2024-02-02 | Disposition: A | Discharge status: Home / Self Care | Attending: Family Medicine | Admitting: Family Medicine

## 2024-02-02 ENCOUNTER — Ambulatory Visit

## 2024-02-02 DIAGNOSIS — J441 Chronic obstructive pulmonary disease with (acute) exacerbation: Secondary | ICD-10-CM

## 2024-02-02 DIAGNOSIS — R0602 Shortness of breath: Secondary | ICD-10-CM

## 2024-02-02 MED ORDER — METHYLPREDNISOLONE SODIUM SUCC 125 MG IJ SOLR
125.0000 mg | Freq: Once | INTRAMUSCULAR | Status: AC
  Administered 2024-02-02: 125 mg via INTRAMUSCULAR

## 2024-02-02 MED ORDER — PREDNISONE 10 MG (48) PO TBPK: ORAL_TABLET | ORAL | 0 refills | Status: DC

## 2024-02-02 MED ORDER — PREDNISONE 10 MG (48) PO TBPK: ORAL_TABLET | ORAL | 0 refills | Status: AC

## 2024-02-02 MED ORDER — IPRATROPIUM-ALBUTEROL 0.5-2.5 (3) MG/3ML IN SOLN
3.0000 mL | Freq: Once | RESPIRATORY_TRACT | Status: AC
  Administered 2024-02-02: 3 mL via RESPIRATORY_TRACT

## 2024-02-02 MED ORDER — ALBUTEROL SULFATE (2.5 MG/3ML) 0.083% IN NEBU: 2.5000 mg | INHALATION_SOLUTION | Freq: Four times a day (QID) | RESPIRATORY_TRACT | 2 refills | Status: AC | PRN

## 2024-02-02 MED ORDER — ALBUTEROL SULFATE (2.5 MG/3ML) 0.083% IN NEBU: 2.5000 mg | INHALATION_SOLUTION | Freq: Four times a day (QID) | RESPIRATORY_TRACT | 2 refills | Status: DC | PRN

## 2024-02-02 NOTE — ED Triage Notes (Signed)
 Patient states that sx have been going on for 10 days. Patient just finished doxy and prednisone . Sx returned when he finished his meds. Patient states that he's having SOB, cough, nasal congestion, wheezing, hard time talking without giving out of breath. No fever

## 2024-02-02 NOTE — ED Provider Notes (Signed)
 " [facility]   [id number] 02/02/24 Arrival Time: 9141  ASSESSMENT & PLAN:  1. SOB (shortness of breath)   2. COPD exacerbation (HCC)    I have personally viewed and independently interpreted the imaging studies ordered this visit. CXR: no acute changes.  Reports moderate improvement after Duoneb and Solu-Medrol . Without resp distress.  Meds ordered this encounter  Medications   methylPREDNISolone  sodium succinate (SOLU-MEDROL ) 125 mg/2 mL injection 125 mg   ipratropium-albuterol  (DUONEB) 0.5-2.5 (3) MG/3ML nebulizer solution 3 mL   albuterol  (PROVENTIL ) (2.5 MG/3ML) 0.083% nebulizer solution    Sig: Take 3 mLs (2.5 mg total) by nebulization every 6 (six) hours as needed for wheezing or shortness of breath.    Dispense:  75 mL    Refill:  2   predniSONE  (STERAPRED UNI-PAK 48 TAB) 10 MG (48) TBPK tablet    Sig: Take as directed.    Dispense:  48 tablet    Refill:  0     Follow-up Information     Llamas, [place] BROCKS, MD.   Specialty: Family Medicine Why: Keep your follow up. Contact information: [address] [place] [address] [phone]         [facility] .   Specialty: Urgent Care Why: If worsening or failing to improve as anticipated. Contact information: [address] [place]  [id number] [phone]                Reviewed expectations re: course of current medical issues. Questions answered. Outlined signs and symptoms indicating need for more acute intervention. Understanding verbalized. After Visit Summary given.   SUBJECTIVE: History from: Patient. Jason Cook is a 59 y.o. male. Patient states that sx have been going on for 10 days. Patient just finished doxy and prednisone . Sx returned when he finished his meds. Patient states that he's having SOB, cough, nasal congestion, wheezing, hard time talking without giving out of breath. No fever  Denies: fever. Normal PO intake  without n/v/d. Tobacco Use History[1]   OBJECTIVE:  Vitals:   02/02/24 0905 02/02/24 0906  BP:  118/73  Pulse:  84  Resp:  18  Temp:  98.1 F (36.7 C)  TempSrc:  Oral  SpO2:  96%  Weight: 73.5 kg     General appearance: alert; no distress Eyes: PERRLA; EOMI; conjunctiva normal HENT: [place]; AT; with nasal congestion Neck: supple  Lungs: speaks full sentences without difficulty; unlabored; significant bilateral wheezing; coughing Extremities: no edema Skin: warm and dry Neurologic: normal gait Psychological: alert and cooperative; normal mood and affect    Imaging: DG Chest 2 View Result Date: 02/02/2024 CLINICAL DATA:  Shortness of breath and cough. EXAM: CHEST - 2 VIEW COMPARISON:  October 06, 2023 FINDINGS: The heart size and mediastinal contours are within normal limits. The lungs are hyperinflated. Both lungs are clear. The visualized skeletal structures are unremarkable. IMPRESSION: No active cardiopulmonary disease. Electronically Signed   By: Suzen Dials M.D.   On: 02/02/2024 10:14    Allergies[2]  Past Medical History:  Diagnosis Date   COPD (chronic obstructive pulmonary disease) (HCC)    Depression    GERD (gastroesophageal reflux disease)    Hypertension    Social History   Socioeconomic History   Marital status: Single    Spouse name: Not on file   Number of children: Not on file   Years of education: Not on file   Highest education level: Not on file  Occupational History  Not on file  Tobacco Use   Smoking status: Every Day    Current packs/day: 0.50    Average packs/day: 0.5 packs/day for 42.0 years (21.0 ttl pk-yrs)    Types: Cigarettes   Smokeless tobacco: Never  Vaping Use   Vaping status: Never Used  Substance and Sexual Activity   Alcohol use: Yes    Alcohol/week: 8.0 standard drinks of alcohol    Types: 6 Cans of beer, 2 Shots of liquor per week    Comment: daily   Drug use: Yes    Types: Marijuana    Comment: daily    Sexual activity: Yes  Other Topics Concern   Not on file  Social History Narrative   Not on file   Social Drivers of Health   Tobacco Use: High Risk (02/02/2024)   Patient History    Smoking Tobacco Use: Every Day    Smokeless Tobacco Use: Never    Passive Exposure: Not on file  Financial Resource Strain: Not on file  Food Insecurity: Not on file  Transportation Needs: Not on file  Physical Activity: Not on file  Stress: Not on file  Social Connections: Not on file  Intimate Partner Violence: Not on file  Depression (EYV7-0): Not on file  Alcohol Screen: Not on file  Housing: Not on file  Utilities: Not on file  Health Literacy: Not on file   Family History  Problem Relation Age of Onset   Hypertension Mother    Diabetes Mother    Cancer Mother    Heart disease Father    Hypertension Father    Cancer Father    Cancer Maternal Grandmother    Cancer Maternal Grandfather    Cancer Paternal Grandmother    Cancer Paternal Grandfather    Past Surgical History:  Procedure Laterality Date   APPENDECTOMY     collapse lung N/A 2015   HERNIA REPAIR     LAPAROSCOPIC APPENDECTOMY  04/21/2018   Procedure: APPENDECTOMY LAPAROSCOPIC;  Surgeon: Desiderio Schanz, MD;  Location: [facility] ORS;  Service: General;;   UMBILICAL HERNIA REPAIR N/A 04/21/2018   Procedure: HERNIA REPAIR UMBILICAL ADULT;  Surgeon: Desiderio Schanz, MD;  Location: [facility] OR[facility];  Service: General;  Laterality: N/A;      [1]  Social History Tobacco Use  Smoking Status Every Day   Current packs/day: 0.50   Average packs/day: 0.5 packs/day for 42.0 years (21.0 ttl pk-yrs)   Types: Cigarettes  Smokeless Tobacco Never  [2]  Allergies Allergen Reactions   Ibuprofen       Rolinda Rogue, MD 02/02/24 1036  "
